# Patient Record
Sex: Female | Born: 1965 | Smoking: Light tobacco smoker
Health system: Southern US, Community
[De-identification: ages and names within clinical notes are randomized; demographics above are authoritative.]

## PROBLEM LIST (undated history)

## (undated) DIAGNOSIS — T4145XA Adverse effect of unspecified anesthetic, initial encounter: Secondary | ICD-10-CM

## (undated) DIAGNOSIS — T8859XA Other complications of anesthesia, initial encounter: Secondary | ICD-10-CM

## (undated) DIAGNOSIS — F329 Major depressive disorder, single episode, unspecified: Secondary | ICD-10-CM

## (undated) DIAGNOSIS — K219 Gastro-esophageal reflux disease without esophagitis: Secondary | ICD-10-CM

## (undated) DIAGNOSIS — F32A Depression, unspecified: Secondary | ICD-10-CM

## (undated) HISTORY — PX: CHOLECYSTECTOMY: SHX55

---
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
## 2017-08-23 DIAGNOSIS — G252 Other specified forms of tremor: Secondary | ICD-10-CM | POA: Diagnosis not present

## 2017-08-23 DIAGNOSIS — I10 Essential (primary) hypertension: Secondary | ICD-10-CM

## 2017-08-23 DIAGNOSIS — F17210 Nicotine dependence, cigarettes, uncomplicated: Secondary | ICD-10-CM | POA: Diagnosis not present

## 2017-08-23 DIAGNOSIS — F10230 Alcohol dependence with withdrawal, uncomplicated: Secondary | ICD-10-CM

## 2017-08-23 DIAGNOSIS — E872 Acidosis: Secondary | ICD-10-CM

## 2017-08-23 DIAGNOSIS — Y909 Presence of alcohol in blood, level not specified: Secondary | ICD-10-CM | POA: Diagnosis not present

## 2017-08-23 DIAGNOSIS — F419 Anxiety disorder, unspecified: Secondary | ICD-10-CM | POA: Diagnosis not present

## 2017-08-23 DIAGNOSIS — R000 Tachycardia, unspecified: Secondary | ICD-10-CM

## 2017-08-24 DIAGNOSIS — R000 Tachycardia, unspecified: Secondary | ICD-10-CM | POA: Diagnosis not present

## 2017-08-24 DIAGNOSIS — F10230 Alcohol dependence with withdrawal, uncomplicated: Secondary | ICD-10-CM | POA: Diagnosis not present

## 2017-08-24 DIAGNOSIS — I10 Essential (primary) hypertension: Secondary | ICD-10-CM | POA: Diagnosis not present

## 2017-08-24 DIAGNOSIS — E872 Acidosis: Secondary | ICD-10-CM | POA: Diagnosis not present

## 2017-08-28 ENCOUNTER — Encounter (HOSPITAL_COMMUNITY): Payer: Self-pay | Admitting: Emergency Medicine
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
## 2017-08-29 ENCOUNTER — Encounter (HOSPITAL_COMMUNITY): Payer: Self-pay

## 2017-08-29 ENCOUNTER — Inpatient Hospital Stay (HOSPITAL_COMMUNITY)
Admission: AD | Admit: 2017-08-29 | Discharge: 2017-08-31 | DRG: 885 | Disposition: A | Discharge status: Home / Self Care | Payer: BC Managed Care – PPO | Source: Intra-hospital | Attending: Psychiatry | Admitting: Psychiatry

## 2017-08-29 ENCOUNTER — Other Ambulatory Visit: Payer: Self-pay

## 2017-08-29 DIAGNOSIS — F332 Major depressive disorder, recurrent severe without psychotic features: Principal | ICD-10-CM | POA: Diagnosis present

## 2017-08-29 DIAGNOSIS — Z598 Other problems related to housing and economic circumstances: Secondary | ICD-10-CM

## 2017-08-29 DIAGNOSIS — R45851 Suicidal ideations: Secondary | ICD-10-CM | POA: Diagnosis present

## 2017-08-29 DIAGNOSIS — F419 Anxiety disorder, unspecified: Secondary | ICD-10-CM | POA: Diagnosis not present

## 2017-08-29 DIAGNOSIS — Z91410 Personal history of adult physical and sexual abuse: Secondary | ICD-10-CM

## 2017-08-29 DIAGNOSIS — F339 Major depressive disorder, recurrent, unspecified: Secondary | ICD-10-CM | POA: Diagnosis not present

## 2017-08-29 DIAGNOSIS — F329 Major depressive disorder, single episode, unspecified: Secondary | ICD-10-CM | POA: Diagnosis present

## 2017-08-29 DIAGNOSIS — F321 Major depressive disorder, single episode, moderate: Secondary | ICD-10-CM | POA: Diagnosis not present

## 2017-08-29 DIAGNOSIS — Z91411 Personal history of adult psychological abuse: Secondary | ICD-10-CM

## 2017-08-29 DIAGNOSIS — G4700 Insomnia, unspecified: Secondary | ICD-10-CM | POA: Diagnosis not present

## 2017-08-29 DIAGNOSIS — F1099 Alcohol use, unspecified with unspecified alcohol-induced disorder: Secondary | ICD-10-CM

## 2017-08-29 DIAGNOSIS — F17210 Nicotine dependence, cigarettes, uncomplicated: Secondary | ICD-10-CM | POA: Diagnosis present

## 2017-08-29 DIAGNOSIS — F4310 Post-traumatic stress disorder, unspecified: Secondary | ICD-10-CM

## 2017-08-29 MED ORDER — HYDROXYZINE HCL 10 MG PO TABS
10.0000 mg | ORAL_TABLET | ORAL | Status: DC | PRN
  Administered 2017-08-29 – 2017-08-30 (×3): 10 mg via ORAL
  Filled 2017-08-29 (×3): qty 1

## 2017-08-29 MED ORDER — CITALOPRAM HYDROBROMIDE 10 MG PO TABS
10.0000 mg | ORAL_TABLET | Freq: Every day | ORAL | Status: DC
  Administered 2017-08-29 – 2017-08-31 (×3): 10 mg via ORAL
  Filled 2017-08-29 (×6): qty 1

## 2017-08-29 MED ORDER — MAGNESIUM HYDROXIDE 400 MG/5ML PO SUSP: 30.0000 mL | Freq: Every day | ORAL | Status: DC | PRN

## 2017-08-29 MED ORDER — FOLIC ACID 1 MG PO TABS
1.0000 mg | ORAL_TABLET | Freq: Every day | ORAL | Status: DC
  Administered 2017-08-29 – 2017-08-31 (×3): 1 mg via ORAL
  Filled 2017-08-29 (×6): qty 1

## 2017-08-29 MED ORDER — LORAZEPAM 0.5 MG PO TABS: 0.5000 mg | ORAL_TABLET | Freq: Four times a day (QID) | ORAL | Status: DC | PRN

## 2017-08-29 MED ORDER — ALUM & MAG HYDROXIDE-SIMETH 200-200-20 MG/5ML PO SUSP: 30.0000 mL | ORAL | Status: DC | PRN

## 2017-08-29 MED ORDER — VITAMIN B-1 100 MG PO TABS
100.0000 mg | ORAL_TABLET | Freq: Every day | ORAL | Status: DC
  Administered 2017-08-29 – 2017-08-31 (×3): 100 mg via ORAL
  Filled 2017-08-29 (×8): qty 1

## 2017-08-29 MED ORDER — TRAZODONE HCL 50 MG PO TABS
50.0000 mg | ORAL_TABLET | Freq: Every evening | ORAL | Status: DC | PRN
  Administered 2017-08-29 – 2017-08-30 (×2): 50 mg via ORAL
  Filled 2017-08-29 (×2): qty 1

## 2017-08-29 MED ORDER — ACETAMINOPHEN 325 MG PO TABS: 650.0000 mg | ORAL_TABLET | Freq: Four times a day (QID) | ORAL | Status: DC | PRN
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
## 2017-08-29 NOTE — Tx Team (Signed)
Initial Treatment Plan 08/29/2017 1:03 PM [name] ZOX:[id number]    PATIENT STRESSORS: Medication change or noncompliance   PATIENT STRENGTHS: Ability for insight Communication skills Motivation for treatment/growth   PATIENT IDENTIFIED PROBLEMS: "I have become depressed been tearful and crying.  I need some help"                     DISCHARGE CRITERIA:  Motivation to continue treatment in a less acute level of care  PRELIMINARY DISCHARGE PLAN: Return to previous living arrangement  PATIENT/FAMILY INVOLVEMENT: This treatment plan has been presented to and reviewed with the patient, [name], and/or family member,   The patient and family have been given the opportunity to ask questions and make suggestions.  Jerrye Bushy, RN 08/29/2017, 1:03 PM

## 2017-08-29 NOTE — Progress Notes (Signed)
[facility] Group Notes:  (Nursing/MHT/Case Management/Adjunct)  Date:  08/29/2017  Time:  6:25 PM  Type of Therapy:  Psychoeducational Skills  Participation Level:  Active  Participation Quality:  Appropriate and Attentive  Affect:  Appropriate and Excited  Cognitive:  Alert  Insight:  Improving  Engagement in Group:  Engaged  Modes of Intervention:  Discussion and Socialization  Summary of Progress/Problems: Patient was active and engaged in group session on stress management skills. Discussed boundary setting, prioritization of goals for the day, time management, and healthy relationships.  Bria Portales C Trenda Corliss 08/29/2017, 6:25 PM 
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
## 2017-08-29 NOTE — [facility] Suicide Risk Assessment (Signed)
[facility] INPATIENT:  Family/Significant Other Suicide Prevention Education  Suicide Prevention Education:  Education Completed; Sharetha Newson (Son) [phone],  (name of family member/significant other) has been identified by the patient as the family member/significant other with whom the patient will be residing, and identified as the person(s) who will aid the patient in the event of a mental health crisis (suicidal ideations/suicide attempt).  With written consent from the patient, the family member/significant other has been provided the following suicide prevention education, prior to the and/or following the discharge of the patient.  The suicide prevention education provided includes the following:  Suicide risk factors  Suicide prevention and interventions  National Suicide Hotline telephone number  [facility] assessment telephone number  [facility] Emergency Assistance 911  [facility] and/or Residential Mobile Crisis Unit telephone number  Request made of family/significant other to:  Remove weapons (e.g., guns, rifles, knives), all items previously/currently identified as safety concern.    Remove drugs/medications (over-the-counter, prescriptions, illicit drugs), all items previously/currently identified as a safety concern.  The family member/significant other verbalizes understanding of the suicide prevention education information provided.  The family member/significant other agrees to remove the items of safety concern listed above. Son did not respond confirming or denying the suggestion to remove or limit access to medications. Son response was "thank you for the information" and the call disconnected.   Shellia Cleverly 08/29/2017, 4:16 PM

## 2017-08-30 DIAGNOSIS — G4700 Insomnia, unspecified: Secondary | ICD-10-CM

## 2017-08-30 DIAGNOSIS — F339 Major depressive disorder, recurrent, unspecified: Secondary | ICD-10-CM

## 2017-08-30 LAB — URINALYSIS, ROUTINE W REFLEX MICROSCOPIC
BILIRUBIN URINE: NEGATIVE
GLUCOSE, UA: NEGATIVE mg/dL
Hgb urine dipstick: NEGATIVE
KETONES UR: 5 mg/dL — AB
Leukocytes, UA: NEGATIVE
NITRITE: NEGATIVE
PH: 6 (ref 5.0–8.0)
Protein, ur: NEGATIVE mg/dL
Specific Gravity, Urine: 1.01 (ref 1.005–1.030)

## 2017-08-30 LAB — RAPID URINE DRUG SCREEN, HOSP PERFORMED
Amphetamines: NOT DETECTED
BARBITURATES: NOT DETECTED
Benzodiazepines: POSITIVE — AB
Cocaine: NOT DETECTED
Opiates: NOT DETECTED
Tetrahydrocannabinol: NOT DETECTED

## 2017-08-30 LAB — HEMOGLOBIN A1C
Hgb A1c MFr Bld: 5.7 % — ABNORMAL HIGH (ref 4.8–5.6)
MEAN PLASMA GLUCOSE: 116.89 mg/dL

## 2017-08-30 LAB — TSH: TSH: 1.519 u[IU]/mL (ref 0.350–4.500)

## 2017-08-30 NOTE — [facility] Group Notes (Signed)
[facility] LCSW Group Therapy Note  Date/Time: 08/30/17, 1315  Type of Therapy and Topic:  Group Therapy:  Overcoming Obstacles  Participation Level:  active  Description of Group:    In this group patients will be encouraged to explore what they see as obstacles to their own wellness and recovery. They will be guided to discuss their thoughts, feelings, and behaviors related to these obstacles. The group will process together ways to cope with barriers, with attention given to specific choices patients can make. Each patient will be challenged to identify changes they are motivated to make in order to overcome their obstacles. This group will be process-oriented, with patients participating in exploration of their own experiences as well as giving and receiving support and challenge from other group members.  Therapeutic Goals: 1. Patient will identify personal and current obstacles as they relate to admission. 2. Patient will identify barriers that currently interfere with their wellness or overcoming obstacles.  3. Patient will identify feelings, thought process and behaviors related to these barriers. 4. Patient will identify two changes they are willing to make to overcome these obstacles:    Summary of Patient Progress: Pt downplayed having obstacles in her life.  Pt stated she was at [facility] due to misunderstanding.  Pt was active in group discussion about finding ways to take steps to overcome obstacles and was a positive part of the group.        Therapeutic Modalities:   Cognitive Behavioral Therapy Solution Focused Therapy Motivational Interviewing Relapse Prevention Therapy  Daleen Squibb, LCSW

## 2017-08-30 NOTE — [facility] Group Notes (Signed)
[facility] Group Notes:  (Nursing/MHT/Case Management/Adjunct)  Date:  08/30/2017  Time:  4:15 pm  Type of Therapy:  Psychoeducational Skills  Participation Level:  Active  Participation Quality:  Appropriate  Affect:  Appropriate  Cognitive:  Appropriate  Insight:  Appropriate  Engagement in Group:  Engaged  Modes of Intervention:  Discussion  Summary of Progress/Problems: Patient alert and engaged appropriately in group.   Earline Mayotte 08/30/2017, 6:14 PM

## 2017-08-30 NOTE — Progress Notes (Signed)
D:  Patient's self inventory sheet, patient sleeps good, sleep medication helpful.  Good appetite, normal energy level, good concentration.  Rated depression, hopeless 1, anxiety 3.  Denied withdrawals.  Denied SI.  Denied physical problems.  Denied physical pain.  Goal is discharge.   A:  Medications administered per MD orders.  Emotional support and encouragement given patient. R:  Denied SI and HI, contracts for safety.  Denied A/V hallucinations.  Safety maintained with 15 minute checks.

## 2017-08-30 NOTE — Tx Team (Signed)
Interdisciplinary Treatment and Diagnostic Plan Update  08/30/2017 Time of Session: 10:00am [name] MRN: [id number]  Principal Diagnosis: <principal problem not specified>  Secondary Diagnoses: Active Problems:   Depression, major, single episode, moderate (HCC)   Major depression, recurrent, chronic (HCC)   Current Medications:  Current Facility-Administered Medications  Medication Dose Route Frequency Provider Last Rate Last Dose  . acetaminophen (TYLENOL) tablet 650 mg  650 mg Oral Q6H PRN Sharma Covert, MD      . alum & mag hydroxide-simeth (MAALOX/MYLANTA) 200-200-20 MG/5ML suspension 30 mL  30 mL Oral Q4H PRN Sharma Covert, MD      . citalopram (CELEXA) tablet 10 mg  10 mg Oral Daily Sharma Covert, MD   10 mg at 08/30/17 0809  . folic acid (FOLVITE) tablet 1 mg  1 mg Oral Daily Sharma Covert, MD   1 mg at 08/30/17 0810  . hydrOXYzine (ATARAX/VISTARIL) tablet 10 mg  10 mg Oral Q4H PRN Sharma Covert, MD   10 mg at 08/30/17 0809  . LORazepam (ATIVAN) tablet 0.5 mg  0.5 mg Oral Q6H PRN Sharma Covert, MD      . magnesium hydroxide (MILK OF MAGNESIA) suspension 30 mL  30 mL Oral Daily PRN Sharma Covert, MD      . thiamine (VITAMIN B-1) tablet 100 mg  100 mg Oral Daily Sharma Covert, MD   100 mg at 08/30/17 0810  . traZODone (DESYREL) tablet 50 mg  50 mg Oral QHS PRN Sharma Covert, MD   50 mg at 08/29/17 2101   PTA Medications: No medications prior to admission.    Patient Stressors: Medication change or noncompliance  Patient Strengths: Ability for insight Agricultural engineer for treatment/growth  Treatment Modalities: Medication Management, Group therapy, Case management,  1 to 1 session with clinician, Psychoeducation, Recreational therapy.   Physician Treatment Plan for Primary Diagnosis: <principal problem not specified> Long Term Goal(s): Improvement in symptoms so as ready for discharge Improvement in  symptoms so as ready for discharge   Short Term Goals: Ability to identify changes in lifestyle to reduce recurrence of condition will improve Ability to verbalize feelings will improve Ability to demonstrate self-control will improve Ability to identify and develop effective coping behaviors will improve Ability to maintain clinical measurements within normal limits will improve Compliance with prescribed medications will improve Ability to identify changes in lifestyle to reduce recurrence of condition will improve Ability to verbalize feelings will improve Ability to disclose and discuss suicidal ideas Ability to demonstrate self-control will improve Ability to identify and develop effective coping behaviors will improve Ability to maintain clinical measurements within normal limits will improve Compliance with prescribed medications will improve Ability to identify triggers associated with substance abuse/mental health issues will improve  Medication Management: Evaluate patient's response, side effects, and tolerance of medication regimen.  Therapeutic Interventions: 1 to 1 sessions, Unit Group sessions and Medication administration.  Evaluation of Outcomes: Not Met  Physician Treatment Plan for Secondary Diagnosis: Active Problems:   Depression, major, single episode, moderate ([name])   Major depression, recurrent, chronic (HCC)  Long Term Goal(s): Improvement in symptoms so as ready for discharge Improvement in symptoms so as ready for discharge   Short Term Goals: Ability to identify changes in lifestyle to reduce recurrence of condition will improve Ability to verbalize feelings will improve Ability to demonstrate self-control will improve Ability to identify and develop effective coping behaviors will improve Ability to maintain clinical measurements within  normal limits will improve Compliance with prescribed medications will improve Ability to identify changes in  lifestyle to reduce recurrence of condition will improve Ability to verbalize feelings will improve Ability to disclose and discuss suicidal ideas Ability to demonstrate self-control will improve Ability to identify and develop effective coping behaviors will improve Ability to maintain clinical measurements within normal limits will improve Compliance with prescribed medications will improve Ability to identify triggers associated with substance abuse/mental health issues will improve     Medication Management: Evaluate patient's response, side effects, and tolerance of medication regimen.  Therapeutic Interventions: 1 to 1 sessions, Unit Group sessions and Medication administration.  Evaluation of Outcomes: Not Met   RN Treatment Plan for Primary Diagnosis: <principal problem not specified> Long Term Goal(s): Knowledge of disease and therapeutic regimen to maintain health will improve  Short Term Goals: Ability to remain free from injury will improve, Ability to demonstrate self-control, Ability to verbalize feelings will improve, Ability to disclose and discuss suicidal ideas and Compliance with prescribed medications will improve  Medication Management: RN will administer medications as ordered by provider, will assess and evaluate patient's response and provide education to patient for prescribed medication. RN will report any adverse and/or side effects to prescribing provider.  Therapeutic Interventions: 1 on 1 counseling sessions, Psychoeducation, Medication administration, Evaluate responses to treatment, Monitor vital signs and CBGs as ordered, Perform/monitor CIWA, COWS, AIMS and Fall Risk screenings as ordered, Perform wound care treatments as ordered.  Evaluation of Outcomes: Not Met   LCSW Treatment Plan for Primary Diagnosis: <principal problem not specified> Long Term Goal(s): Safe transition to appropriate next level of care at discharge, Engage patient in therapeutic  group addressing interpersonal concerns.  Short Term Goals: Engage patient in aftercare planning with referrals and resources, Increase social support, Increase ability to appropriately verbalize feelings, Increase emotional regulation, Facilitate acceptance of mental health diagnosis and concerns, Facilitate patient progression through stages of change regarding substance use diagnoses and concerns, Identify triggers associated with mental health/substance abuse issues and Increase skills for wellness and recovery  Therapeutic Interventions: Assess for all discharge needs, 1 to 1 time with Social worker, Explore available resources and support systems, Assess for adequacy in community support network, Educate family and significant other(s) on suicide prevention, Complete Psychosocial Assessment, Interpersonal group therapy.  Evaluation of Outcomes: Not Met   Progress in Treatment: Attending groups: No. Participating in groups: No. Taking medication as prescribed: Yes. Toleration medication: Yes. Family/Significant other contact made: Yes, individual(s) contacted:  the patient's son Patient understands diagnosis: Yes. Discussing patient identified problems/goals with staff: Yes. Medical problems stabilized or resolved: Yes. Denies suicidal/homicidal ideation: Yes. Issues/concerns per patient self-inventory: No. Other:   New problem(s) identified: None   New Short Term/Long Term Goal(s):medication stabilization, elimination of SI thoughts, development of comprehensive mental wellness plan.   Patient Goals: "I dont have any goals, because I am not sick"  Discharge Plan or Barriers: CSW will continue to assess for an appropriate discharge plan   Reason for Continuation of Hospitalization: Medication stabilization  Estimated Length of Stay: Wednesday, 09/01/17   Attendees: Patient: [name]  08/30/2017 8:36 AM  Physician: Dr. Neita Garnet, MD 08/30/2017 8:36 AM  Nursing:  Rise Paganini.Raliegh Ip, RN 08/30/2017 8:36 AM  RN Care Manager: Rhunette Croft 08/30/2017 8:36 AM  Social Worker: Radonna Ricker, [place] 08/30/2017 8:36 AM  Recreational Therapist: X 08/30/2017 8:36 AM  Other: X 08/30/2017 8:36 AM  Other: X 08/30/2017 8:36 AM  Other:X 08/30/2017 8:36 AM    Scribe  for Treatment Team: Chevis Pretty 08/30/2017 8:36 AM

## 2017-08-30 NOTE — Progress Notes (Signed)
Patient ID: [name], female   DOB: Oct 20, 1965, 52 y.o.   MRN: [id number]  Pt currently presents with a masked affect and anxious behavior. Pt seen pacing in the hallways tonight. Pt reports to Clinical research associate that she feels as if she is doing well. Pt intrusive into other patients care, reports that she "would like to work in mental health." Unable to report any learned coping skills but reported benefiting from groups and discussions of self care. Pt reports not wanting to take sleep medication as it makes her feel "drowsy in the morning." Pt has concerns that this will keep her from discharge.  Pt provided with medications per providers orders. Pt's labs and vitals were monitored throughout the night. Pt given a 1:1 about emotional and mental status. Pt supported and encouraged to express concerns and questions. Pt educated on medications.  Pt's safety ensured with 15 minute and environmental checks. Pt currently denies SI/HI and A/V hallucinations. Pt verbally agrees to seek staff if SI/HI or A/VH occurs and to consult with staff before acting on any harmful thoughts. Will continue POC.

## 2017-08-30 NOTE — Progress Notes (Signed)
Patient ID: [name], female   DOB: [birth year], 52 y.o.   MRN: [id number] Pt continually minimizes situation-when asked about what brought her in, she state, "I was with my friend just suddenly I started cry. I don't think there was a reason for my crying; just started crying for no reason." Pt endorsed moderate anxiety; "I am only anxious because I'm here; I'm sure you would be anxious too if you were in my shoes." Pt denied SI/HI, AVH, pain or depression. Medications offered as prescribed. All patient's questions and concerns addressed. Support, encouragement, and safe environment provided. 15-minute safety checks continue. Pt was med compliant. Safety checks continue.

## 2017-08-30 NOTE — Progress Notes (Signed)
Pt attended group this evening. 

## 2017-08-30 NOTE — Progress Notes (Signed)
PATIENT SIGNED VOLUNTARY ADMISSION FORM ON 08/30/2017 AT 4:50 P.M.

## 2017-08-30 NOTE — Progress Notes (Signed)
[facility] MD Progress Note  08/30/2017 4:08 PM [name]  MRN:  [id number] Subjective:  Patient states she is feeling "OK". Today denies feeling depressed, denies SI. Currently denies suicidal ideations, and presents future oriented . Objective : I have discussed case with treatment team and have met with patient. 52 year old female, presented to [facility] ED reporting depression and binge drinking. Was discharged on Librium, became sedated, returned to ED, leading to concerns about suicidality, safety.  At this time patient denies feeling depressed, and presents with a full range of affect. Denies suicidal ideations and is future oriented, currently focused on being discharged soon. Denies medication side effects- currently on Celexa 10 mgrs QDAY. Does not present with symptoms of alcohol WDL - vitals stable. Describes episodic/infrequent drinking and denies pattern of regular consumption. Visible on unit, pleasant on approach.  Labs reviewed as below- TSH 1.51, HgbA1C slightly elevated at 5.7   Principal Problem: Consider Adjustment Disorder  Diagnosis:   Patient Active Problem List   Diagnosis Date Noted  . Major depression, recurrent, chronic ([place]) [F33.9] 08/29/2017  . Depression, major, single episode, moderate ([place]) [F32.1]    Total Time spent with patient: 20 minutes  Past Psychiatric History:  Past Medical History: History reviewed. No pertinent past medical history. History reviewed. No pertinent surgical history. Family History: History reviewed. No pertinent family history. Family Psychiatric  History:  Social History:  Social History   Substance and Sexual Activity  Alcohol Use Never  . Frequency: Never     Social History   Substance and Sexual Activity  Drug Use Never    Social History   Socioeconomic History  . Marital status: Unknown    Spouse name: Not on file  . Number of children: Not on file  . Years of education: Not on file  . Highest education  level: Not on file  Occupational History  . Not on file  Social Needs  . Financial resource strain: Not on file  . Food insecurity:    Worry: Not on file    Inability: Not on file  . Transportation needs:    Medical: Not on file    Non-medical: Not on file  Tobacco Use  . Smoking status: Light Tobacco Smoker    Packs/day: 0.25    Years: 10.00    Pack years: 2.50    Types: Cigarettes  . Smokeless tobacco: Never Used  Substance and Sexual Activity  . Alcohol use: Never    Frequency: Never  . Drug use: Never  . Sexual activity: Not on file  Lifestyle  . Physical activity:    Days per week: Not on file    Minutes per session: Not on file  . Stress: Not on file  Relationships  . Social connections:    Talks on phone: Not on file    Gets together: Not on file    Attends religious service: Not on file    Active member of club or organization: Not on file    Attends meetings of clubs or organizations: Not on file    Relationship status: Not on file  Other Topics Concern  . Not on file  Social History Narrative  . Not on file   Additional Social History:    Pain Medications: pt denies abuse - see pta meds list Prescriptions: pt denies abuse- see pta meds list Over the Counter: pt denies abuse - see pta meds list History of alcohol / drug use?: Yes Longest period of sobriety (when/how  long): n/a Name of Substance 1: etoh 1 - Age of First Use: unable to assess 1 - Amount (size/oz): unable to assess 1 - Last Use / Amount: unable to assess - BAL < .01  Sleep: Good  Appetite:  Good  Current Medications: Current Facility-Administered Medications  Medication Dose Route Frequency Provider Last Rate Last Dose  . acetaminophen (TYLENOL) tablet 650 mg  650 mg Oral Q6H PRN Sharma Covert, MD      . alum & mag hydroxide-simeth (MAALOX/MYLANTA) 200-200-20 MG/5ML suspension 30 mL  30 mL Oral Q4H PRN Sharma Covert, MD      . citalopram (CELEXA) tablet 10 mg  10 mg Oral  Daily Sharma Covert, MD   10 mg at 08/30/17 0809  . folic acid (FOLVITE) tablet 1 mg  1 mg Oral Daily Sharma Covert, MD   1 mg at 08/30/17 0810  . hydrOXYzine (ATARAX/VISTARIL) tablet 10 mg  10 mg Oral Q4H PRN Sharma Covert, MD   10 mg at 08/30/17 0809  . LORazepam (ATIVAN) tablet 0.5 mg  0.5 mg Oral Q6H PRN Sharma Covert, MD      . magnesium hydroxide (MILK OF MAGNESIA) suspension 30 mL  30 mL Oral Daily PRN Sharma Covert, MD      . thiamine (VITAMIN B-1) tablet 100 mg  100 mg Oral Daily Sharma Covert, MD   100 mg at 08/30/17 0810  . traZODone (DESYREL) tablet 50 mg  50 mg Oral QHS PRN Sharma Covert, MD   50 mg at 08/29/17 2101    Lab Results:  Results for orders placed or performed during the hospital encounter of 08/29/17 (from the past 48 hour(s))  TSH     Status: None   Collection Time: 08/30/17  6:29 AM  Result Value Ref Range   TSH 1.519 0.350 - 4.500 uIU/mL    Comment: Performed by a 3rd Generation assay with a functional sensitivity of <=0.01 uIU/mL. Performed at [facility], [place] [address] [place], [place] [address]   Hemoglobin A1c     Status: Abnormal   Collection Time: 08/30/17  6:29 AM  Result Value Ref Range   Hgb A1c MFr Bld 5.7 (H) 4.8 - 5.6 %    Comment: (NOTE) Pre diabetes:          5.7%-6.4% Diabetes:              >6.4% Glycemic control for   <7.0% adults with diabetes    Mean Plasma Glucose 116.89 mg/dL    Comment: Performed at [place] [address] [place], [place] [address]  Urinalysis, Routine w reflex microscopic     Status: Abnormal   Collection Time: 08/30/17  6:44 AM  Result Value Ref Range   Color, Urine YELLOW YELLOW   APPearance CLEAR CLEAR   Specific Gravity, Urine 1.010 1.005 - 1.030   pH 6.0 5.0 - 8.0   Glucose, UA NEGATIVE NEGATIVE mg/dL   Hgb urine dipstick NEGATIVE NEGATIVE   Bilirubin Urine NEGATIVE NEGATIVE   Ketones, ur 5 (A) NEGATIVE mg/dL   Protein, ur NEGATIVE  NEGATIVE mg/dL   Nitrite NEGATIVE NEGATIVE   Leukocytes, UA NEGATIVE NEGATIVE    Comment: Performed at [facility], [place] [address] [place],  [address]  Urine rapid drug screen (hosp performed)not at [facility]     Status: Abnormal   Collection Time: 08/30/17  6:44 AM  Result Value Ref Range  Opiates NONE DETECTED NONE DETECTED   Cocaine NONE DETECTED NONE DETECTED   Benzodiazepines POSITIVE (A) NONE DETECTED   Amphetamines NONE DETECTED NONE DETECTED   Tetrahydrocannabinol NONE DETECTED NONE DETECTED   Barbiturates NONE DETECTED NONE DETECTED    Comment: (NOTE) DRUG SCREEN FOR MEDICAL PURPOSES ONLY.  IF CONFIRMATION IS NEEDED FOR ANY PURPOSE, NOTIFY LAB WITHIN 5 DAYS. LOWEST DETECTABLE LIMITS FOR URINE DRUG SCREEN Drug Class                     Cutoff (ng/mL) Amphetamine and metabolites    1000 Barbiturate and metabolites    200 Benzodiazepine                 537 Tricyclics and metabolites     300 Opiates and metabolites        300 Cocaine and metabolites        300 THC                            50 Performed at [facility], [place] [address] [place], [place] [address]     Blood Alcohol level:  No results found for: [facility]  Metabolic Disorder Labs: Lab Results  Component Value Date   HGBA1C 5.7 (H) 08/30/2017   MPG 116.89 08/30/2017   No results found for: PROLACTIN No results found for: CHOL, TRIG, HDL, CHOLHDL, VLDL, LDLCALC  Physical Findings: AIMS: Facial and Oral Movements Muscles of Facial Expression: None, normal Lips and Perioral Area: None, normal Jaw: None, normal Tongue: None, normal,Extremity Movements Upper (arms, wrists, hands, fingers): None, normal Lower (legs, knees, ankles, toes): None, normal, Trunk Movements Neck, shoulders, hips: None, normal, Overall Severity Severity of abnormal movements (highest score from questions above): None, normal Incapacitation due to abnormal movements: None,  normal Patient's awareness of abnormal movements (rate only patient's report): No Awareness, Dental Status Current problems with teeth and/or dentures?: No Does patient usually wear dentures?: No  CIWA:  CIWA-Ar Total: 1 COWS:  COWS Total Score: 1  Musculoskeletal: Strength & Muscle Tone: within normal limits- no tremors, no diaphoresis, no restlessness or acute distress  Gait & Station: normal Patient leans: N/A  Psychiatric Specialty Exam: Physical Exam  ROS denies chest pain, no shortness of breath, no vomiting   Blood pressure 134/90, pulse 76, temperature 97.8 F (36.6 C), temperature source Oral, resp. rate 18, height 5' 4"  (1.626 m), weight 86.2 kg (190 lb), SpO2 98 %.Body mass index is 32.61 kg/m.  General Appearance: Well Groomed  Eye Contact:  Good  Speech:  Normal Rate  Volume:  Normal  Mood:  currently reports mood as normal , denies depression  Affect:  Appropriate and reactive, slightly anxious   Thought Process:  Linear and Descriptions of Associations: Intact  Orientation:  Full (Time, Place, and Person)  Thought Content:  no hallucinations, no delusions, not internally preoccupied   Suicidal Thoughts:  No currently denies suicidal or self injurious ideations, denies any homicidal or violent ideations, contracts for safety on unit   Homicidal Thoughts:  No  Memory:  recent and remote grossly intact   Judgement:  Fair- improving   Insight:  Fair  Psychomotor Activity:  Normal- no restlessness or agitation  Concentration:  Concentration: Good and Attention Span: Good  Recall:  Good  Fund of Knowledge:  Good  Language:  Good  Akathisia:  Negative  Handed:  Right  AIMS (if indicated):  Assets:  Communication Skills Desire for Improvement Resilience  ADL's:  Intact  Cognition:  WNL  Sleep:  Number of Hours: 5.75   Assessment -  52 year old female, presented to ED x 2 recently in the context of depression, episode of heavy drinking, and then sedation.  Currently reports she is doing well, and at this time denies depression and minimizes neuro-vegetative symptoms. Denies SI and her current focus is to discharge soon. No alcohol WDL symptoms at this time.  Treatment Plan Summary: Daily contact with patient to assess and evaluate symptoms and progress in treatment, Medication management, Plan inpatient treatment  and medications as below Encourage group and milieu participation to work on coping skills and symptom reduction Continue Celexa 10 mgrs QDAY for depression, anxiety Continue Trazodone 50 mgrs QHS PRN for insomnia Continue Vistaril 10 mgrs Q 6 hours PRN for anxiety as needed Continue Ativan 0.5 mgrs Q 6 hours PRN for anxiety if needed  Treatment team working on disposition planning options Jenne Campus, MD 08/30/2017, 4:08 PM

## 2017-08-30 NOTE — Progress Notes (Signed)
Adult Psychoeducational Group Note  Date:  08/30/2017 Time:  8:55 AM  Group Topic/Focus:  Orientation:   The focus of this group is to educate the patient on the purpose and policies of crisis stabilization and provide a format to answer questions about their admission.  The group details unit policies and expectations of patients while admitted.  Participation Level:  Active  Participation Quality:  Attentive  Affect:  Appropriate  Cognitive:  Appropriate  Insight: Appropriate  Engagement in Group:  Engaged  Modes of Intervention:  Discussion  Additional Comments:  Pt attended the morning group. She was excited about the wellness group coming up. I waned her to think about her definition of wellness to share in the next group after I gave her the book's.   Deforest Hoyles Vernia Teem 08/30/2017, 8:55 AM

## 2017-08-30 NOTE — Plan of Care (Signed)
Nurse discussed depression, anxiety, coping skills with patient.  

## 2017-08-31 DIAGNOSIS — F419 Anxiety disorder, unspecified: Secondary | ICD-10-CM

## 2017-08-31 DIAGNOSIS — F332 Major depressive disorder, recurrent severe without psychotic features: Principal | ICD-10-CM

## 2017-08-31 MED ORDER — HYDROXYZINE HCL 10 MG PO TABS: 10.0000 mg | ORAL_TABLET | ORAL | 0 refills | Status: DC | PRN

## 2017-08-31 MED ORDER — CITALOPRAM HYDROBROMIDE 20 MG PO TABS
20.0000 mg | ORAL_TABLET | Freq: Every day | ORAL | Status: DC
  Filled 2017-08-31 (×2): qty 1

## 2017-08-31 MED ORDER — TRAZODONE HCL 50 MG PO TABS: 50.0000 mg | ORAL_TABLET | Freq: Every evening | ORAL | 0 refills | Status: DC | PRN

## 2017-08-31 MED ORDER — CITALOPRAM HYDROBROMIDE 20 MG PO TABS: 20.0000 mg | ORAL_TABLET | Freq: Every day | ORAL | 0 refills | Status: DC

## 2017-08-31 NOTE — Progress Notes (Signed)
Discharge Note:  Patient discharged home with family member.  Patient denied SI and HI.  Denied A/V hallucinations.  Denied pain.  Suicide prevention information given and discussed with patient who stated she understood and had no questions.  Patient stated she appreciated all assistance received from [facility] staff.  Patient stated she received all her belongings, clothing, toiletries, misc items, prescriptions, etc.  All required discharge information given to patient at discharge.

## 2017-08-31 NOTE — BHH Suicide Risk Assessment (Signed)
[facility] Discharge Suicide Risk Assessment   Principal Problem: <principal problem not specified> Discharge Diagnoses:  Patient Active Problem List   Diagnosis Date Noted  . Major depression, recurrent, chronic ([facility]) [F33.9] 08/29/2017  . Depression, major, single episode, moderate ([facility]) [F32.1]     Total Time spent with patient: 30 minutes  Musculoskeletal: Strength & Muscle Tone: within normal limits Gait & Station: normal Patient leans: N/A  Psychiatric Specialty Exam: Review of Systems  All other systems reviewed and are negative.   Blood pressure 127/90, pulse 88, temperature 97.8 F (36.6 C), temperature source Oral, resp. rate 18, height  (1.626 m), weight 86.2 kg (190 lb), SpO2 98 %.Body mass index is 32.61 kg/m.  General Appearance: Casual  Eye Contact::  Good  Speech:  Normal Rate409  Volume:  Normal  Mood:  Euthymic  Affect:  Congruent  Thought Process:  Coherent  Orientation:  Full (Time, Place, and Person)  Thought Content:  Logical  Suicidal Thoughts:  No  Homicidal Thoughts:  No  Memory:  Immediate;   Fair  Judgement:  Intact  Insight:  Fair  Psychomotor Activity:  Increased  Concentration:  Good  Recall:  [employer] of Knowledge:Good  Language: Fair  Akathisia:  Negative  Handed:  Right  AIMS (if indicated):     Assets:  Communication Skills Desire for Improvement Housing Intimacy Resilience Social Support  Sleep:  Number of Hours: 6  Cognition: WNL  ADL's:  Intact   Mental Status Per Nursing Assessment::   On Admission:  NA  Demographic Factors:  Divorced or widowed and Low socioeconomic status  Loss Factors: NA  Historical Factors: Impulsivity  Risk Reduction Factors:   NA  Continued Clinical Symptoms:  Alcohol/Substance Abuse/Dependencies  Cognitive Features That Contribute To Risk:  None    Suicide Risk:  Minimal: No identifiable suicidal ideation.  Patients presenting with no risk factors but with morbid ruminations;  may be classified as minimal risk based on the severity of the depressive symptoms    Plan Of Care/Follow-up recommendations:  Activity:  ad lib  Diamond Pert, MD 08/31/2017, 9:15 AM
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
## 2017-08-31 NOTE — Progress Notes (Signed)
D:  Patient's self inventory sheet, patient sleeps good, sleep medication helpful.  Good appetite, normal energy level, good concentration.  Rated depression, hopeless and anxiety #1.  Denied withdrawals.  Denied SI.  Denied physial problems.  Denied physical pain.  Goal is discharge.  Plans to follow procedures for that purpose.  Thank you to staff.  They are great people with lots of compassion and understanding.  Always willing to hear and provide assistance. A:  Medications administered per MD orders.  Emotional support and encouragement given patient. R:  Denied SI and HI, contracts for safety.  Denied A/V hallucinations.  Safety maintained with 15 minute checks.

## 2017-08-31 NOTE — Progress Notes (Signed)
  [facility] Adult Case Management Discharge Plan :  Will you be returning to the same living situation after discharge:  Yes,  patient is returning home with her husband and children At discharge, do you have transportation home?: Yes,  patient reports a family member will pick her up Do you have the ability to pay for your medications: Yes,  [facility]  Release of information consent forms completed and in the chart;  Patient's signature needed at discharge.  Patient to Follow up at: Follow-up Information    [facility] [employer]. Go on 09/01/2017.   Why:  Appointment is Wednesday, 09/01/17 at 1:45pm. Please bring your Photo ID, social security card, proof of insurance or proof of income, and any discharge paperwork from your hospitalization.  Contact information: [address] [place] [place] [address] [phone]           Next level of care provider has access to [facility] Link:yes  Safety Planning and Suicide Prevention discussed: Yes,  with the patient's son  Have you used any form of tobacco in the last 30 days? (Cigarettes, Smokeless Tobacco, Cigars, and/or Pipes): Yes  Has patient been referred to the Quitline?: Patient refused referral  Patient has been referred for addiction treatment: N/A  Maeola Sarah, LCSWA 08/31/2017, 11:09 AM

## 2019-06-14 ENCOUNTER — Emergency Department (HOSPITAL_COMMUNITY): Payer: BC Managed Care – PPO

## 2019-06-14 ENCOUNTER — Other Ambulatory Visit: Payer: Self-pay

## 2019-06-14 ENCOUNTER — Inpatient Hospital Stay (HOSPITAL_COMMUNITY)
Admission: EM | Admit: 2019-06-14 | Discharge: 2019-06-16 | DRG: 897 | Disposition: A | Discharge status: Home / Self Care | Payer: BC Managed Care – PPO | Attending: Internal Medicine | Admitting: Internal Medicine

## 2019-06-14 ENCOUNTER — Encounter (HOSPITAL_COMMUNITY): Payer: Self-pay | Admitting: Emergency Medicine

## 2019-06-14 DIAGNOSIS — R000 Tachycardia, unspecified: Secondary | ICD-10-CM | POA: Diagnosis present

## 2019-06-14 DIAGNOSIS — F10929 Alcohol use, unspecified with intoxication, unspecified: Secondary | ICD-10-CM | POA: Diagnosis present

## 2019-06-14 DIAGNOSIS — F10939 Alcohol use, unspecified with withdrawal, unspecified: Secondary | ICD-10-CM | POA: Diagnosis present

## 2019-06-14 DIAGNOSIS — F10229 Alcohol dependence with intoxication, unspecified: Secondary | ICD-10-CM | POA: Diagnosis present

## 2019-06-14 DIAGNOSIS — R112 Nausea with vomiting, unspecified: Secondary | ICD-10-CM | POA: Diagnosis not present

## 2019-06-14 DIAGNOSIS — G312 Degeneration of nervous system due to alcohol: Secondary | ICD-10-CM | POA: Diagnosis present

## 2019-06-14 DIAGNOSIS — Z20822 Contact with and (suspected) exposure to covid-19: Secondary | ICD-10-CM | POA: Diagnosis present

## 2019-06-14 DIAGNOSIS — R251 Tremor, unspecified: Secondary | ICD-10-CM | POA: Diagnosis present

## 2019-06-14 DIAGNOSIS — F339 Major depressive disorder, recurrent, unspecified: Secondary | ICD-10-CM | POA: Diagnosis present

## 2019-06-14 DIAGNOSIS — E860 Dehydration: Secondary | ICD-10-CM | POA: Diagnosis present

## 2019-06-14 DIAGNOSIS — F10920 Alcohol use, unspecified with intoxication, uncomplicated: Secondary | ICD-10-CM

## 2019-06-14 DIAGNOSIS — F10930 Alcohol use, unspecified with withdrawal, uncomplicated: Secondary | ICD-10-CM

## 2019-06-14 DIAGNOSIS — E872 Acidosis, unspecified: Secondary | ICD-10-CM

## 2019-06-14 DIAGNOSIS — N179 Acute kidney failure, unspecified: Secondary | ICD-10-CM | POA: Diagnosis present

## 2019-06-14 DIAGNOSIS — F17210 Nicotine dependence, cigarettes, uncomplicated: Secondary | ICD-10-CM | POA: Diagnosis present

## 2019-06-14 DIAGNOSIS — G9340 Encephalopathy, unspecified: Secondary | ICD-10-CM

## 2019-06-14 DIAGNOSIS — F10239 Alcohol dependence with withdrawal, unspecified: Secondary | ICD-10-CM | POA: Diagnosis present

## 2019-06-14 DIAGNOSIS — Z811 Family history of alcohol abuse and dependence: Secondary | ICD-10-CM

## 2019-06-14 DIAGNOSIS — F10230 Alcohol dependence with withdrawal, uncomplicated: Secondary | ICD-10-CM

## 2019-06-14 DIAGNOSIS — E876 Hypokalemia: Secondary | ICD-10-CM | POA: Diagnosis present

## 2019-06-14 DIAGNOSIS — F419 Anxiety disorder, unspecified: Secondary | ICD-10-CM | POA: Diagnosis present

## 2019-06-14 HISTORY — DX: Gastro-esophageal reflux disease without esophagitis: K21.9

## 2019-06-14 HISTORY — DX: Other complications of anesthesia, initial encounter: T88.59XA

## 2019-06-14 HISTORY — DX: Depression, unspecified: F32.A

## 2019-06-14 LAB — ACETAMINOPHEN LEVEL: Acetaminophen (Tylenol), Serum: <10 ug/mL — ABNORMAL LOW (ref 10–30)

## 2019-06-14 LAB — COMPREHENSIVE METABOLIC PANEL
ALT: 64 U/L — ABNORMAL HIGH (ref 0–44)
AST: 83 U/L — ABNORMAL HIGH (ref 15–41)
Albumin: 3.8 g/dL (ref 3.5–5.0)
Alkaline Phosphatase: 59 U/L (ref 38–126)
Anion gap: 18 — ABNORMAL HIGH (ref 5–15)
BUN: 10 mg/dL (ref 6–20)
CO2: 21 mmol/L — ABNORMAL LOW (ref 22–32)
Calcium: 8.6 mg/dL — ABNORMAL LOW (ref 8.9–10.3)
Chloride: 100 mmol/L (ref 98–111)
Creatinine, Ser: 2.01 mg/dL — ABNORMAL HIGH (ref 0.44–1.00)
GFR calc Af Amer: 32 mL/min — ABNORMAL LOW (ref >60)
GFR calc non Af Amer: 28 mL/min — ABNORMAL LOW (ref >60)
Glucose, Bld: 149 mg/dL — ABNORMAL HIGH (ref 70–99)
Potassium: 4.2 mmol/L (ref 3.5–5.1)
Sodium: 139 mmol/L (ref 135–145)
Total Bilirubin: 1.4 mg/dL — ABNORMAL HIGH (ref 0.3–1.2)
Total Protein: 7 g/dL (ref 6.5–8.1)

## 2019-06-14 LAB — CBC
HCT: 40.2 % (ref 36.0–46.0)
Hemoglobin: 13.5 g/dL (ref 12.0–15.0)
MCH: 28.7 pg (ref 26.0–34.0)
MCHC: 33.6 g/dL (ref 30.0–36.0)
MCV: 85.5 fL (ref 80.0–100.0)
Platelets: 286 10*3/uL (ref 150–400)
RBC: 4.7 MIL/uL (ref 3.87–5.11)
RDW: 14.7 % (ref 11.5–15.5)
WBC: 8.8 10*3/uL (ref 4.0–10.5)
nRBC: 0 % (ref 0.0–0.2)

## 2019-06-14 LAB — SALICYLATE LEVEL: Salicylate Lvl: <7 mg/dL — ABNORMAL LOW (ref 7.0–30.0)

## 2019-06-14 LAB — LACTIC ACID, PLASMA
Lactic Acid, Venous: 3.6 mmol/L (ref 0.5–1.9)
Lactic Acid, Venous: 2.1 mmol/L (ref 0.5–1.9)

## 2019-06-14 LAB — ETHANOL: Alcohol, Ethyl (B): <10 mg/dL (ref <10)

## 2019-06-14 LAB — I-STAT BETA HCG BLOOD, ED (MC, WL, AP ONLY): I-stat hCG, quantitative: <5 m[IU]/mL (ref <5)

## 2019-06-14 LAB — LIPASE, BLOOD: Lipase: 37 U/L (ref 11–51)

## 2019-06-14 LAB — AMMONIA: Ammonia: 15 umol/L (ref 9–35)

## 2019-06-14 MED ORDER — LORAZEPAM 2 MG/ML IJ SOLN
1.0000 mg | Freq: Once | INTRAMUSCULAR | Status: AC
  Administered 2019-06-14: 19:00:00 1 mg via INTRAVENOUS
  Filled 2019-06-14: qty 1

## 2019-06-14 MED ORDER — LORAZEPAM 1 MG PO TABS: 0.0000 mg | ORAL_TABLET | Freq: Two times a day (BID) | ORAL | Status: DC

## 2019-06-14 MED ORDER — ONDANSETRON HCL 4 MG/2ML IJ SOLN
4.0000 mg | Freq: Once | INTRAMUSCULAR | Status: AC
  Administered 2019-06-14: 19:00:00 4 mg via INTRAVENOUS
  Filled 2019-06-14: qty 2

## 2019-06-14 MED ORDER — LACTATED RINGERS IV BOLUS
1000.0000 mL | Freq: Once | INTRAVENOUS | Status: AC
  Administered 2019-06-14: 1000 mL via INTRAVENOUS

## 2019-06-14 MED ORDER — LORAZEPAM 2 MG/ML IJ SOLN
0.0000 mg | Freq: Four times a day (QID) | INTRAMUSCULAR | Status: DC
  Administered 2019-06-15: 2 mg via INTRAVENOUS
  Filled 2019-06-14: qty 1

## 2019-06-14 MED ORDER — SODIUM CHLORIDE 0.9 % IV BOLUS
1000.0000 mL | Freq: Once | INTRAVENOUS | Status: AC
  Administered 2019-06-14: 1000 mL via INTRAVENOUS

## 2019-06-14 MED ORDER — THIAMINE HCL 100 MG PO TABS
100.0000 mg | ORAL_TABLET | Freq: Every day | ORAL | Status: DC
  Administered 2019-06-14 – 2019-06-16 (×3): 100 mg via ORAL
  Filled 2019-06-14 (×3): qty 1

## 2019-06-14 MED ORDER — LORAZEPAM 1 MG PO TABS
0.0000 mg | ORAL_TABLET | Freq: Four times a day (QID) | ORAL | Status: DC
  Filled 2019-06-14: qty 2

## 2019-06-14 MED ORDER — SODIUM CHLORIDE 0.9 % IV BOLUS
1000.0000 mL | Freq: Once | INTRAVENOUS | Status: AC
  Administered 2019-06-14: 19:00:00 1000 mL via INTRAVENOUS

## 2019-06-14 MED ORDER — LORAZEPAM 2 MG/ML IJ SOLN: 0.0000 mg | Freq: Two times a day (BID) | INTRAMUSCULAR | Status: DC

## 2019-06-14 MED ORDER — THIAMINE HCL 100 MG/ML IJ SOLN: 100.0000 mg | Freq: Every day | INTRAMUSCULAR | Status: DC

## 2019-06-14 MED ORDER — SODIUM CHLORIDE 0.9% FLUSH
3.0000 mL | Freq: Once | INTRAVENOUS | Status: AC
  Administered 2019-06-14: 3 mL via INTRAVENOUS

## 2019-06-14 NOTE — ED Notes (Signed)
Visually checked on Pt. Pt was sleeping but at 88 - 90 % with a good pleth. Repositioned pt into more of a upright position and she returned to 96%. Monitored for a while to make sure she remained WDl.

## 2019-06-14 NOTE — ED Triage Notes (Addendum)
Pt arrives to ED from home with complaints of vomiting and diarrhea x3 days. Patient seems to be too intoxicated and in pain to respond to triage questions so triage questions obtained from husband. Per husband patient took an unknown pill today and drank with it and since has had abdominal pain and vomiting. Pts husband states that they usually go to [place] but here today because she has cirrhosis.

## 2019-06-14 NOTE — ED Provider Notes (Signed)
[facility] EMERGENCY DEPARTMENT Provider Note   CSN: [id number] Arrival date & time: 06/14/19  1453     History Chief Complaint  Patient presents with  . Alcohol Intoxication  . Emesis    [name] is a 54 y.o. female.  Patient with history of depression, alcohol abuse --brought into the emergency department tonight with altered mental status, concern for alcohol intoxication.  Patient lives at home with her brother [name].  They have been intervening in the patient's drinking recently.  Per family, patient is suspected of sneaking out today and drinking.  Patient admits to drinking a bottle of wine earlier today.  She also reports several days of vomiting and agitation with decrease in drinking.  No seizures or hallucinations per family.  Level 5 caveat due to altered mental status.        History reviewed. No pertinent past medical history.  Patient Active Problem List   Diagnosis Date Noted  . Major depression, recurrent, chronic (HCC) 08/29/2017    History reviewed. No pertinent surgical history.   OB History    Gravida  1   Para      Term      Preterm      AB      Living        SAB      TAB      Ectopic      Multiple      Live Births              History reviewed. No pertinent family history.  Social History   Tobacco Use  . Smoking status: Light Tobacco Smoker    Packs/day: 0.25    Years: 10.00    Pack years: 2.50    Types: Cigarettes  . Smokeless tobacco: Never Used  Substance Use Topics  . Alcohol use: Never  . Drug use: Never    Home Medications Prior to Admission medications   Medication Sig Start Date End Date Taking? Authorizing Provider  citalopram (CELEXA) 20 MG tablet Take 1 tablet (20 mg total) by mouth daily. Patient not taking: Reported on 06/14/2019 09/01/17   Charm Rings, NP  hydrOXYzine (ATARAX/VISTARIL) 10 MG tablet Take 1 tablet (10 mg total) by mouth every 4 (four) hours as needed for  anxiety. Patient not taking: Reported on 06/14/2019 08/31/17   Charm Rings, NP  traZODone (DESYREL) 50 MG tablet Take 1 tablet (50 mg total) by mouth at bedtime as needed for sleep. Patient not taking: Reported on 06/14/2019 08/31/17   Charm Rings, NP    Allergies    Patient has no known allergies.  Review of Systems   Review of Systems  Unable to perform ROS: Mental status change    Physical Exam Updated Vital Signs BP 110/63   Pulse (!) 117   Temp 99 F (37.2 C) (Oral)   Resp (!) 26   Ht 5\' 4"  (1.626 m)   Wt 85.3 kg   SpO2 97%   BMI 32.27 kg/m   Physical Exam Vitals and nursing note reviewed.  Constitutional:      Appearance: She is well-developed.  HENT:     Head: Normocephalic and atraumatic.     Right Ear: Tympanic membrane, ear canal and external ear normal.     Left Ear: Tympanic membrane, ear canal and external ear normal.  Eyes:     General:        Right eye: No discharge.  Left eye: No discharge.     Conjunctiva/sclera: Conjunctivae normal.     Pupils: Pupils are equal, round, and reactive to light.  Cardiovascular:     Rate and Rhythm: Regular rhythm. Tachycardia present.     Heart sounds: Normal heart sounds.  Pulmonary:     Effort: Pulmonary effort is normal.     Breath sounds: Normal breath sounds.  Abdominal:     Palpations: Abdomen is soft.     Tenderness: There is no abdominal tenderness. There is no guarding or rebound.  Musculoskeletal:     Cervical back: Normal range of motion and neck supple.  Skin:    General: Skin is warm and dry.  Neurological:     General: No focal deficit present.     Mental Status: She is alert. She is disoriented.     Motor: No weakness.     Comments: Patient is mildly tremulous, she can move all extremities on command. Speech is slightly slurred.  No facial droop.     ED Results / Procedures / Treatments   Labs (all labs ordered are listed, but only abnormal results are displayed) Labs Reviewed   COMPREHENSIVE METABOLIC PANEL - Abnormal; Notable for the following components:      Result Value   CO2 21 (*)    Glucose, Bld 149 (*)    Creatinine, Ser 2.01 (*)    Calcium 8.6 (*)    AST 83 (*)    ALT 64 (*)    Total Bilirubin 1.4 (*)    GFR calc non Af Amer 28 (*)    GFR calc Af Amer 32 (*)    Anion gap 18 (*)    All other components within normal limits  ACETAMINOPHEN LEVEL - Abnormal; Notable for the following components:   Acetaminophen (Tylenol), Serum <10 (*)    All other components within normal limits  SALICYLATE LEVEL - Abnormal; Notable for the following components:   Salicylate Lvl <3.1 (*)    All other components within normal limits  LACTIC ACID, PLASMA - Abnormal; Notable for the following components:   Lactic Acid, Venous 3.6 (*)    All other components within normal limits  LACTIC ACID, PLASMA - Abnormal; Notable for the following components:   Lactic Acid, Venous 2.1 (*)    All other components within normal limits  URINE CULTURE  LIPASE, BLOOD  CBC  AMMONIA  ETHANOL  URINALYSIS, ROUTINE W REFLEX MICROSCOPIC  RAPID URINE DRUG SCREEN, HOSP PERFORMED  I-STAT BETA HCG BLOOD, ED ([facility], [facility], [facility] ONLY)    ED ECG REPORT   Date: 06/14/2019  Rate: 138  Rhythm: sinus tachycardia  QRS Axis: left  Intervals: normal  ST/T Wave abnormalities: normal  Conduction Disutrbances:none  Narrative Interpretation:   Old EKG Reviewed: none available  I have personally reviewed the EKG tracing and agree with the computerized printout as noted.   Radiology DG Chest Port 1 View  Result Date: 06/14/2019 CLINICAL DATA:  Altered level of consciousness, vomiting, and diarrhea for 3 days, light smoker EXAM: PORTABLE CHEST 1 VIEW COMPARISON:  Portable exam 5176 hours compared to 06/09/2019 FINDINGS: Normal heart size, mediastinal contours, and pulmonary vascularity. Lungs clear. No pulmonary infiltrate, pleural effusion, or pneumothorax. Osseous structures unremarkable. IMPRESSION:  No acute abnormalities. Electronically Signed   By: Lavonia Dana M.D.   On: 06/14/2019 19:04    Procedures Procedures (including critical care time)  Medications Ordered in ED Medications  LORazepam (ATIVAN) injection 0-4 mg (has no administration in  time range)    Or  LORazepam (ATIVAN) tablet 0-4 mg (has no administration in time range)  LORazepam (ATIVAN) injection 0-4 mg (has no administration in time range)    Or  LORazepam (ATIVAN) tablet 0-4 mg (has no administration in time range)  thiamine tablet 100 mg (has no administration in time range)    Or  thiamine (B-1) injection 100 mg (has no administration in time range)  sodium chloride flush (NS) 0.9 % injection 3 mL (3 mLs Intravenous Given 06/14/19 1856)  sodium chloride 0.9 % bolus 1,000 mL (0 mLs Intravenous Stopped 06/14/19 1853)  ondansetron (ZOFRAN) injection 4 mg (4 mg Intravenous Given 06/14/19 1854)  sodium chloride 0.9 % bolus 1,000 mL (1,000 mLs Intravenous New Bag/Given 06/14/19 1854)  LORazepam (ATIVAN) injection 1 mg (1 mg Intravenous Given 06/14/19 1856)    ED Course  I have reviewed the triage vital signs and the nursing notes.  Pertinent labs & imaging results that were available during my care of the patient were reviewed by me and considered in my medical decision making (see chart for details).  Patient seen and examined.  Patient is somewhat confused with slurred speech on arrival.  RN was able to speak with the patient's brother and obtain additional history as documented above.  Family member Spanish-speaking.  Confirmed no hallucinations.  Vital signs reviewed and are as follows: BP 110/63   Pulse (!) 117   Temp 99 F (37.2 C) (Oral)   Resp (!) 26   Ht 5\' 4"  (1.626 m)   Wt 85.3 kg   SpO2 97%   BMI 32.27 kg/m   Initial work-up with negative alcohol, normal ammonia.  On recheck, patient is more alert.  She gives additional history about drinking wine today.  Will ensure no abnormalities on head CT.   Patient will need admission for suspected alcohol withdrawal, CIWA protocol, AKI.   CIWA score was 11 per RN.    Lactate noted to be elevated at 3.6.  Will check chest x-ray and UA.  Patient without fevers.  At this point suspect that tachycardia and overall condition related to alcohol withdrawal.  I do not suspect primary infection at this point.  We will continue to hydrate and will recheck lactate.  8:20 PM Spoke with Dr. of [facility] who will see patient.      MDM Rules/Calculators/A&P                      Admit for suspected alcohol withdrawal, dehydration.   Final Clinical Impression(s) / ED Diagnoses Final diagnoses:  Alcohol withdrawal syndrome without complication (HCC)  Encephalopathy  Acute kidney injury [facility])    Rx / DC Orders ED Discharge Orders    None       [facility], PA-C 06/14/19 2022    2023, MD 06/14/19 2146

## 2019-06-14 NOTE — ED Notes (Signed)
Pt was sleeping. Woke Pt to check responsiveness. Pt answered questions and responded to commands.

## 2019-06-14 NOTE — H&P (Signed)
[facility] History and Physical  [name] NIO:[id number] DOB: 1966-01-31 DOA: 06/14/2019  Referring ED provider: Emmit Alexanders, PA PCP: Patient, No Pcp Per   Chief Complaint: Alcohol intoxication  HPI: [name] is a 54 y.o. female with PMH of MDD and alcohol abuse was brought to ER by family due to concern for alcohol intoxication. Found to have elevated lactate and AKI. Patient unable to provide almost any history. Reports feeling nauseous and that she had abdominal pain earlier but not currently. Unable to assess further. Per ED who spoke with patient's brother: "They have been intervening in the patient's drinking recently.  Per family, patient is suspected of sneaking out today and drinking.  Patient admits to drinking a bottle of wine earlier today.  She also reports several days of vomiting and agitation with decrease in drinking.  No seizures or hallucinations per family." Per son [name], he reports that she has been an alcoholic for as long as he can remember. Denies hx of withdrawal seizures, DT's, hallucinations, SI/HI. She will drink anything but prefers wine.   In the ED: Tachycardic with normal BP's and afebrile. Maintaining sats. CMP with Cr 2.01, glucose 149, AST 83, ALT 64. Lactate 3.6.   Review of Systems:  Unable to perform full ROS as patient with altered mental status. Did ask the following and patient denies but unreliable. Denies fever, chills, headache, dizziness, cough, SOB, chest pain,  diarrhea, constipation, dysuria, hematuria, hematochezia, melena, difficulty moving arms/legs, confusion or any other complaints. Alcohol level < 10. CBC, Tylenol, Salicylate, Ammonia level WNL. UA and UDS pending to be collected. Patient was given Ativan, thiamine and 2L IVF.  CT Head: Negative non contrasted CT appearance of the brain. CXR: No acute cardiopulmonary abnormality.  Called for admission due to suspected alcohol withdrawal, CIWA protocol, AKI.   History  reviewed. No pertinent past medical history. History reviewed. No pertinent surgical history. Social History:  reports that she has been smoking cigarettes. She has a 2.50 pack-year smoking history. She has never used smokeless tobacco. She reports that she does not drink alcohol or use drugs.  No Known Allergies  History reviewed.  Per son, [name]: No family hx of DM, HTN, HLD. Patient's father and grandfather with history of alcoholism.   Prior to Admission medications   Medication Sig Start Date End Date Taking? Authorizing Provider  citalopram (CELEXA) 20 MG tablet Take 1 tablet (20 mg total) by mouth daily. Patient not taking: Reported on 06/14/2019 09/01/17 06/14/19  Charm Rings, NP  traZODone (DESYREL) 50 MG tablet Take 1 tablet (50 mg total) by mouth at bedtime as needed for sleep. Patient not taking: Reported on 06/14/2019 08/31/17 06/14/19  Charm Rings, NP   Physical Exam: Vitals:   06/14/19 1813 06/14/19 1900 06/14/19 2019 06/14/19 2051  BP:  103/68 112/67 (!) 112/99  Pulse:  (!) 112 (!) 112 (!) 118  Resp:  (!) 21  16  Temp:      TempSrc:      SpO2:  97%  100%  Weight: 85.3 kg     Height: 5\' 4"  (1.626 m)       Wt Readings from Last 3 Encounters:  06/14/19 85.3 kg    General:  Resting in bed. Will barely open eyes. Mumbles responses. Oriented to person and place; unable to assess further.  Eyes: Barely opens eyes to commands but appears to have EOMI. ENT: grossly normal hearing, lips & tongue Neck: normal ROM Cardiovascular: Tachycardic with regular rhythm, no  m/r/g. No LE edema. Respiratory: CTA bilaterally, no w/r/r. Normal respiratory effort. Abdomen: soft, ntnd Skin: no rash or induration seen on limited exam Musculoskeletal: grossly normal tone BUE/BLE Psychiatric: grossly normal; slurred speech  Neurologic: grossly non-focal. Able to move all extremities. No facial droop.          Labs on Admission:  Basic Metabolic Panel: Recent Labs  Lab 06/14/19 1512   NA 139  K 4.2  CL 100  CO2 21*  GLUCOSE 149*  BUN 10  CREATININE 2.01*  CALCIUM 8.6*   Liver Function Tests: Recent Labs  Lab 06/14/19 1512  AST 83*  ALT 64*  ALKPHOS 59  BILITOT 1.4*  PROT 7.0  ALBUMIN 3.8   Recent Labs  Lab 06/14/19 1512  LIPASE 37   Recent Labs  Lab 06/14/19 1655  AMMONIA 15   CBC: Recent Labs  Lab 06/14/19 1512  WBC 8.8  HGB 13.5  HCT 40.2  MCV 85.5  PLT 286   Cardiac Enzymes: No results for input(s): CKTOTAL, CKMB, CKMBINDEX, TROPONINI in the last 168 hours.  BNP (last 3 results) No results for input(s): BNP in the last 8760 hours.  ProBNP (last 3 results) No results for input(s): PROBNP in the last 8760 hours.  CBG: No results for input(s): GLUCAP in the last 168 hours.  Radiological Exams on Admission: CT Head Wo Contrast  Result Date: 06/14/2019 CLINICAL DATA:  Altered mental status EXAM: CT HEAD WITHOUT CONTRAST TECHNIQUE: Contiguous axial images were obtained from the base of the skull through the vertex without intravenous contrast. COMPARISON:  CT brain 08/27/2017 FINDINGS: Brain: No acute territorial infarction, hemorrhage or intracranial mass. The ventricles are nonenlarged. Vascular: No hyperdense vessels.  No unexpected calcification Skull: Normal. Negative for fracture or focal lesion. Sinuses/Orbits: No acute finding. Other: None IMPRESSION: Negative non contrasted CT appearance of the brain. Electronically Signed   By: Donavan Foil M.D.   On: 06/14/2019 20:01   DG Chest Port 1 View  Result Date: 06/14/2019 CLINICAL DATA:  Altered level of consciousness, vomiting, and diarrhea for 3 days, light smoker EXAM: PORTABLE CHEST 1 VIEW COMPARISON:  Portable exam 1834 hours compared to 06/09/2019 FINDINGS: Normal heart size, mediastinal contours, and pulmonary vascularity. Lungs clear. No pulmonary infiltrate, pleural effusion, or pneumothorax. Osseous structures unremarkable. IMPRESSION: No acute abnormalities. Electronically  Signed   By: Lavonia Dana M.D.   On: 06/14/2019 19:04    EKG: Independently reviewed. HR 140. Sinus tachycardia. QTc 454. No STEMI.  Assessment/Plan Principal Problem:   Alcohol intoxication ([place]) Active Problems:   Major depression, recurrent, chronic (HCC)   Lactic acidosis   AKI (acute kidney injury) ([place])  54 y.o. female with PMH of MDD and alcohol abuse was brought to ER by family due to concern for alcohol intoxication. Found to have elevated lactate and AKI.  1. Alcohol Intoxication - last drink earlier today at unknown time; drank a bottle of wine - presents with tachycardia , slurred speech and history of nausea/vomiting with poor PO intake over last several days - CIWA protocol initiated and patient did receive Ativan although appears intoxicated on my exam and may not be withdrawing yet - Per family, no hx of withdrawal seizures, SI/HI, hallucinations - Patient will go several days without drinking and then binge per son - Cont CIWA and Ativan - Cont vitamins - F/u UDS - SW consulted for resources although patient unlikely desires to change habits - If AKI, lactate and AMS resolve by 3/4, patient can  likely discharge home  2. Elevated Lactate - Low suspicion for infection, likely due to dehydration and intoxication - Cont to follow lactate until normal; LR bolus ordered on admission - Afebrile, WBC WNL - CXR without acute findings - F/u UA  3. AKI - likely prerenal due to dehydration and hx of vomiting - Fluid rehydration and reassess in AM; consider urine lytes if not resolved - Cr 2.01 on admission; no prior for baseline but family denies CKD history   4. Depression - Hold Celexa due to acute encephalopathy   Code Status: Full - per family, patient unable to respond DVT Prophylaxis: Lovenox Family Communication: ED provider spoke with brother [name] and I spoke with son [name] by phone on admission. Disposition Plan: Admit to observation with Telemetry. Patient  with alcohol intoxication with elevated lactate and AKI. Assuming these resolve by tomorrow and mental status improves, likely discharge home 3/4.  Time spent: 45 minutes  Joselyn Arrow [facility] Pager [phone]

## 2019-06-15 ENCOUNTER — Encounter (HOSPITAL_COMMUNITY): Payer: Self-pay | Admitting: Family Medicine

## 2019-06-15 DIAGNOSIS — E860 Dehydration: Secondary | ICD-10-CM | POA: Diagnosis present

## 2019-06-15 DIAGNOSIS — F10230 Alcohol dependence with withdrawal, uncomplicated: Secondary | ICD-10-CM | POA: Diagnosis present

## 2019-06-15 DIAGNOSIS — F10229 Alcohol dependence with intoxication, unspecified: Secondary | ICD-10-CM | POA: Diagnosis present

## 2019-06-15 DIAGNOSIS — F10920 Alcohol use, unspecified with intoxication, uncomplicated: Secondary | ICD-10-CM | POA: Diagnosis not present

## 2019-06-15 DIAGNOSIS — R112 Nausea with vomiting, unspecified: Secondary | ICD-10-CM | POA: Diagnosis present

## 2019-06-15 DIAGNOSIS — G312 Degeneration of nervous system due to alcohol: Secondary | ICD-10-CM | POA: Diagnosis present

## 2019-06-15 DIAGNOSIS — E876 Hypokalemia: Secondary | ICD-10-CM | POA: Diagnosis present

## 2019-06-15 DIAGNOSIS — F10939 Alcohol use, unspecified with withdrawal, unspecified: Secondary | ICD-10-CM | POA: Diagnosis present

## 2019-06-15 DIAGNOSIS — R251 Tremor, unspecified: Secondary | ICD-10-CM | POA: Diagnosis present

## 2019-06-15 DIAGNOSIS — Z811 Family history of alcohol abuse and dependence: Secondary | ICD-10-CM | POA: Diagnosis not present

## 2019-06-15 DIAGNOSIS — F10239 Alcohol dependence with withdrawal, unspecified: Secondary | ICD-10-CM | POA: Diagnosis present

## 2019-06-15 DIAGNOSIS — F17210 Nicotine dependence, cigarettes, uncomplicated: Secondary | ICD-10-CM | POA: Diagnosis present

## 2019-06-15 DIAGNOSIS — N179 Acute kidney failure, unspecified: Secondary | ICD-10-CM | POA: Diagnosis present

## 2019-06-15 DIAGNOSIS — F339 Major depressive disorder, recurrent, unspecified: Secondary | ICD-10-CM | POA: Diagnosis present

## 2019-06-15 DIAGNOSIS — Z20822 Contact with and (suspected) exposure to covid-19: Secondary | ICD-10-CM | POA: Diagnosis present

## 2019-06-15 DIAGNOSIS — E872 Acidosis: Secondary | ICD-10-CM | POA: Diagnosis present

## 2019-06-15 DIAGNOSIS — R000 Tachycardia, unspecified: Secondary | ICD-10-CM | POA: Diagnosis present

## 2019-06-15 DIAGNOSIS — F419 Anxiety disorder, unspecified: Secondary | ICD-10-CM | POA: Diagnosis present

## 2019-06-15 LAB — COMPREHENSIVE METABOLIC PANEL
ALT: 49 U/L — ABNORMAL HIGH (ref 0–44)
AST: 46 U/L — ABNORMAL HIGH (ref 15–41)
Albumin: 3.1 g/dL — ABNORMAL LOW (ref 3.5–5.0)
Alkaline Phosphatase: 48 U/L (ref 38–126)
Anion gap: 10 (ref 5–15)
BUN: 6 mg/dL (ref 6–20)
CO2: 24 mmol/L (ref 22–32)
Calcium: 7.8 mg/dL — ABNORMAL LOW (ref 8.9–10.3)
Chloride: 106 mmol/L (ref 98–111)
Creatinine, Ser: 1.5 mg/dL — ABNORMAL HIGH (ref 0.44–1.00)
GFR calc Af Amer: 46 mL/min — ABNORMAL LOW (ref >60)
GFR calc non Af Amer: 40 mL/min — ABNORMAL LOW (ref >60)
Glucose, Bld: 130 mg/dL — ABNORMAL HIGH (ref 70–99)
Potassium: 3.2 mmol/L — ABNORMAL LOW (ref 3.5–5.1)
Sodium: 140 mmol/L (ref 135–145)
Total Bilirubin: 0.7 mg/dL (ref 0.3–1.2)
Total Protein: 5.7 g/dL — ABNORMAL LOW (ref 6.5–8.1)

## 2019-06-15 LAB — URINALYSIS, ROUTINE W REFLEX MICROSCOPIC
Bilirubin Urine: NEGATIVE
Glucose, UA: NEGATIVE mg/dL
Ketones, ur: 20 mg/dL — AB
Leukocytes,Ua: NEGATIVE
Nitrite: NEGATIVE
Protein, ur: NEGATIVE mg/dL
Specific Gravity, Urine: 1.014 (ref 1.005–1.030)
pH: 7 (ref 5.0–8.0)

## 2019-06-15 LAB — CBC
HCT: 33.3 % — ABNORMAL LOW (ref 36.0–46.0)
Hemoglobin: 10.9 g/dL — ABNORMAL LOW (ref 12.0–15.0)
MCH: 29.1 pg (ref 26.0–34.0)
MCHC: 32.7 g/dL (ref 30.0–36.0)
MCV: 88.8 fL (ref 80.0–100.0)
Platelets: 207 10*3/uL (ref 150–400)
RBC: 3.75 MIL/uL — ABNORMAL LOW (ref 3.87–5.11)
RDW: 15.1 % (ref 11.5–15.5)
WBC: 7.9 10*3/uL (ref 4.0–10.5)
nRBC: 0 % (ref 0.0–0.2)

## 2019-06-15 LAB — RAPID URINE DRUG SCREEN, HOSP PERFORMED
Amphetamines: NOT DETECTED
Barbiturates: NOT DETECTED
Benzodiazepines: POSITIVE — AB
Cocaine: NOT DETECTED
Opiates: NOT DETECTED
Tetrahydrocannabinol: NOT DETECTED

## 2019-06-15 LAB — SARS CORONAVIRUS 2 (TAT 6-24 HRS): SARS Coronavirus 2: NEGATIVE

## 2019-06-15 LAB — MAGNESIUM: Magnesium: 2 mg/dL (ref 1.7–2.4)

## 2019-06-15 LAB — PHOSPHORUS: Phosphorus: 2.6 mg/dL (ref 2.5–4.6)

## 2019-06-15 LAB — HIV ANTIBODY (ROUTINE TESTING W REFLEX): HIV Screen 4th Generation wRfx: NONREACTIVE

## 2019-06-15 LAB — LACTIC ACID, PLASMA: Lactic Acid, Venous: 1.3 mmol/L (ref 0.5–1.9)

## 2019-06-15 MED ORDER — LORAZEPAM 2 MG/ML IJ SOLN
1.0000 mg | INTRAMUSCULAR | Status: DC | PRN
  Administered 2019-06-15: 1 mg via INTRAVENOUS
  Administered 2019-06-16: 2 mg via INTRAVENOUS
  Filled 2019-06-15 (×2): qty 1

## 2019-06-15 MED ORDER — ONDANSETRON HCL 4 MG/2ML IJ SOLN
4.0000 mg | Freq: Four times a day (QID) | INTRAMUSCULAR | Status: DC | PRN
  Administered 2019-06-15: 4 mg via INTRAVENOUS
  Filled 2019-06-15: qty 2

## 2019-06-15 MED ORDER — LORAZEPAM 1 MG PO TABS
1.0000 mg | ORAL_TABLET | ORAL | Status: DC | PRN
  Administered 2019-06-15: 2 mg via ORAL
  Administered 2019-06-15: 1 mg via ORAL
  Administered 2019-06-15: 2 mg via ORAL
  Filled 2019-06-15 (×3): qty 2

## 2019-06-15 MED ORDER — POTASSIUM CHLORIDE CRYS ER 20 MEQ PO TBCR
40.0000 meq | EXTENDED_RELEASE_TABLET | ORAL | Status: AC
  Administered 2019-06-15 (×2): 40 meq via ORAL
  Filled 2019-06-15 (×2): qty 2

## 2019-06-15 MED ORDER — ENOXAPARIN SODIUM 40 MG/0.4ML ~~LOC~~ SOLN
40.0000 mg | SUBCUTANEOUS | Status: DC
  Administered 2019-06-16: 40 mg via SUBCUTANEOUS
  Filled 2019-06-15: qty 0.4

## 2019-06-15 MED ORDER — ENOXAPARIN SODIUM 40 MG/0.4ML ~~LOC~~ SOLN
40.0000 mg | Freq: Every day | SUBCUTANEOUS | Status: DC
  Administered 2019-06-15: 40 mg via SUBCUTANEOUS
  Filled 2019-06-15: qty 0.4

## 2019-06-15 MED ORDER — ADULT MULTIVITAMIN W/MINERALS CH
1.0000 | ORAL_TABLET | Freq: Every day | ORAL | Status: DC
  Administered 2019-06-15 – 2019-06-16 (×2): 1 via ORAL
  Filled 2019-06-15 (×2): qty 1

## 2019-06-15 MED ORDER — SODIUM CHLORIDE 0.9 % IV SOLN
INTRAVENOUS | Status: DC
  Administered 2019-06-15: 1000 mL via INTRAVENOUS

## 2019-06-15 MED ORDER — FOLIC ACID 1 MG PO TABS
1.0000 mg | ORAL_TABLET | Freq: Every day | ORAL | Status: DC
  Administered 2019-06-15 – 2019-06-16 (×2): 1 mg via ORAL
  Filled 2019-06-15 (×2): qty 1

## 2019-06-15 NOTE — ED Notes (Signed)
Visually checked on Pt. She was sleeping well, vitals WDL

## 2019-06-15 NOTE — Social Work (Signed)
CSW acknowledging consult for substance use. Will evaluate pt needs and interest in resources.   Jerrine Urschel, MSW, LCSW [facility]   

## 2019-06-15 NOTE — ED Notes (Signed)
Pt ambulatory to the restroom with a steady gait.

## 2019-06-15 NOTE — ED Notes (Signed)
Hooked patient back up to the monitor after the bathroom 

## 2019-06-15 NOTE — Progress Notes (Signed)
PROGRESS NOTE    [name]  ZOX:[id number] DOB: 16-Apr-1965 DOA: 06/14/2019 PCP: Patient, No Pcp Per   Brief Narrative:  HPI: [name] is a 54 y.o. female with PMH of MDD and alcohol abuse was brought to ER by family due to concern for alcohol intoxication. Found to have elevated lactate and AKI. Patient unable to provide almost any history. Reports feeling nauseous and that she had abdominal pain earlier but not currently. Unable to assess further. Per ED who spoke with patient's brother: "They have been intervening in the patient's drinking recently. Per family, patient is suspected of sneaking out today and drinking. Patient admits to drinking a bottle of wine earlier today. She also reports several days of vomiting and agitation with decrease in drinking. No seizures or hallucinations per family." Per son [name], he reports that she has been an alcoholic for as long as he can remember. Denies hx of withdrawal seizures, DT's, hallucinations, SI/HI. She will drink anything but prefers wine.   In the ED: Tachycardic with normal BP's and afebrile. Maintaining sats. CMP with Cr 2.01, glucose 149, AST 83, ALT 64. Lactate 3.6.  Assessment & Plan:   Principal Problem:   Alcohol intoxication (HCC) Active Problems:   Major depression, recurrent, chronic (HCC)   Lactic acidosis   AKI (acute kidney injury) (HCC)   Alcohol intoxication/alcohol withdrawal: Last drink yesterday morning on 06/14/2019.  Has some withdrawal currently.  Slightly elevated CIWA score.  Received only few doses of Ativan in last 24 hours.  Agreeable to stay overnight to seek help to go through the system.  Continue CIWA protocol, multivitamin.  Lactic acidosis: Resolved.  AKI/dehydration: Creatinine improving.  Will start on IV fluids.  Depression, chronic: Holding Celexa and trazodone for now.  Hypokalemia: 3.2.  Replace.  Recheck in the morning.  DVT prophylaxis: Lovenox   Code Status: Full Code    Family Communication:  None present at bedside.  Plan of care discussed with patient in length and he verbalized understanding and agreed with it. Patient is from: Home Disposition Plan: Home Barriers to discharge: Active alcohol withdrawal   Estimated body mass index is 32.27 kg/m as calculated from the following:   Height as of this encounter: 5\' 4"  (1.626 m).   Weight as of this encounter: 85.3 kg.      Nutritional status:               Consultants:   None  Procedures:   None  Antimicrobials:   None   Subjective: Seen and examined.  Complains of headache and some anxiety.  Has some tremors in upper extremities.  Agreeable to stay in the hospital for treatment of alcohol withdrawal.  Objective: Vitals:   06/15/19 1145 06/15/19 1209 06/15/19 1225 06/15/19 1312  BP:  133/80 133/80   Pulse: 98 (!) 101 99 86  Resp: 19  20   Temp:      TempSrc:      SpO2: 98%  98%   Weight:      Height:        Intake/Output Summary (Last 24 hours) at 06/15/2019 1353 Last data filed at 06/14/2019 1853 Gross per 24 hour  Intake 1000 ml  Output 300 ml  Net 700 ml   Filed Weights   06/14/19 1812 06/14/19 1813  Weight: 85.3 kg 85.3 kg    Examination:  General exam: Appears calm and comfortable  Respiratory system: Clear to auscultation. Respiratory effort normal. Cardiovascular system: S1 & S2 heard, RRR.  No JVD, murmurs, rubs, gallops or clicks. No pedal edema. Gastrointestinal system: Abdomen is nondistended, soft and nontender. No organomegaly or masses felt. Normal bowel sounds heard. Central nervous system: Alert and oriented. No focal neurological deficits. Extremities: Symmetric 5 x 5 power.  Fine tremors upper extremities bilaterally Skin: No rashes, lesions or ulcers Psychiatry: Judgement and insight appear normal. Mood & affect depressed.   Data Reviewed: I have personally reviewed following labs and imaging studies  CBC: Recent Labs  Lab  06/14/19 1512 06/15/19 0145  WBC 8.8 7.9  HGB 13.5 10.9*  HCT 40.2 33.3*  MCV 85.5 88.8  PLT 286 207   Basic Metabolic Panel: Recent Labs  Lab 06/14/19 1512 06/15/19 0115 06/15/19 0145  NA 139  --  140  K 4.2  --  3.2*  CL 100  --  106  CO2 21*  --  24  GLUCOSE 149*  --  130*  BUN 10  --  6  CREATININE 2.01*  --  1.50*  CALCIUM 8.6*  --  7.8*  MG  --  2.0  --   PHOS  --  2.6  --    GFR: Estimated Creatinine Clearance: 46.9 mL/min (A) (by C-G formula based on SCr of 1.5 mg/dL (H)). Liver Function Tests: Recent Labs  Lab 06/14/19 1512 06/15/19 0145  AST 83* 46*  ALT 64* 49*  ALKPHOS 59 48  BILITOT 1.4* 0.7  PROT 7.0 5.7*  ALBUMIN 3.8 3.1*   Recent Labs  Lab 06/14/19 1512  LIPASE 37   Recent Labs  Lab 06/14/19 1655  AMMONIA 15   Coagulation Profile: No results for input(s): INR, PROTIME in the last 168 hours. Cardiac Enzymes: No results for input(s): CKTOTAL, CKMB, CKMBINDEX, TROPONINI in the last 168 hours. BNP (last 3 results) No results for input(s): PROBNP in the last 8760 hours. HbA1C: No results for input(s): HGBA1C in the last 72 hours. CBG: No results for input(s): GLUCAP in the last 168 hours. Lipid Profile: No results for input(s): CHOL, HDL, LDLCALC, TRIG, CHOLHDL, LDLDIRECT in the last 72 hours. Thyroid Function Tests: No results for input(s): TSH, T4TOTAL, FREET4, T3FREE, THYROIDAB in the last 72 hours. Anemia Panel: No results for input(s): VITAMINB12, FOLATE, FERRITIN, TIBC, IRON, RETICCTPCT in the last 72 hours. Sepsis Labs: Recent Labs  Lab 06/14/19 1655 06/14/19 1943 06/15/19 0145  LATICACIDVEN 3.6* 2.1* 1.3    Recent Results (from the past 240 hour(s))  SARS CORONAVIRUS 2 (TAT 6-24 HRS) Nasopharyngeal Nasopharyngeal Swab     Status: None   Collection Time: 06/14/19  8:54 PM   Specimen: Nasopharyngeal Swab  Result Value Ref Range Status   SARS Coronavirus 2 NEGATIVE NEGATIVE Final    Comment: (NOTE) SARS-CoV-2 target  nucleic acids are NOT DETECTED. The SARS-CoV-2 RNA is generally detectable in upper and lower respiratory specimens during the acute phase of infection. Negative results do not preclude SARS-CoV-2 infection, do not rule out co-infections with other pathogens, and should not be used as the sole basis for treatment or other patient management decisions. Negative results must be combined with clinical observations, patient history, and epidemiological information. The expected result is Negative. Fact Sheet for Patients: HairSlick.no Fact Sheet for Healthcare Providers: quierodirigir.com This test is not yet approved or cleared by the [place] FDA and  has been authorized for detection and/or diagnosis of SARS-CoV-2 by FDA under an Emergency Use Authorization (EUA). This EUA will remain  in effect (meaning this test can be used) for the duration  of the COVID-19 declaration under Section 56 4(b)(1) of the Act, 21 U.S.C. section 360bbb-3(b)(1), unless the authorization is terminated or revoked sooner. Performed at [facility], [place] [address], [place], [place] [address]       Radiology Studies: CT Head Wo Contrast  Result Date: 06/14/2019 CLINICAL DATA:  Altered mental status EXAM: CT HEAD WITHOUT CONTRAST TECHNIQUE: Contiguous axial images were obtained from the base of the skull through the vertex without intravenous contrast. COMPARISON:  CT brain 08/27/2017 FINDINGS: Brain: No acute territorial infarction, hemorrhage or intracranial mass. The ventricles are nonenlarged. Vascular: No hyperdense vessels.  No unexpected calcification Skull: Normal. Negative for fracture or focal lesion. Sinuses/Orbits: No acute finding. Other: None IMPRESSION: Negative non contrasted CT appearance of the brain. Electronically Signed   By: Donavan Foil M.D.   On: 06/14/2019 20:01   DG Chest Port 1 View  Result Date: 06/14/2019 CLINICAL DATA:   Altered level of consciousness, vomiting, and diarrhea for 3 days, light smoker EXAM: PORTABLE CHEST 1 VIEW COMPARISON:  Portable exam 1834 hours compared to 06/09/2019 FINDINGS: Normal heart size, mediastinal contours, and pulmonary vascularity. Lungs clear. No pulmonary infiltrate, pleural effusion, or pneumothorax. Osseous structures unremarkable. IMPRESSION: No acute abnormalities. Electronically Signed   By: Lavonia Dana M.D.   On: 06/14/2019 19:04    Scheduled Meds: . folic acid  1 mg Oral Daily  . multivitamin with minerals  1 tablet Oral Daily  . thiamine  100 mg Oral Daily   Or  . thiamine  100 mg Intravenous Daily   Continuous Infusions:   LOS: 0 days   Time spent: 33 minutes   Darliss Cheney, MD [facility]  06/15/2019, 1:53 PM   To contact the attending provider between 7A-7P or the covering provider during after hours 7P-7A, please log into the web site [email or web address].

## 2019-06-15 NOTE — ED Notes (Signed)
Lunch Tray Ordered @ 1054. 

## 2019-06-15 NOTE — ED Notes (Signed)
Visually checked on Pt and she was sleeping , well, Vitals WDL

## 2019-06-16 ENCOUNTER — Encounter (HOSPITAL_COMMUNITY): Payer: Self-pay | Admitting: Surgery

## 2019-06-16 LAB — BASIC METABOLIC PANEL
Anion gap: 10 (ref 5–15)
BUN: <5 mg/dL — ABNORMAL LOW (ref 6–20)
CO2: 22 mmol/L (ref 22–32)
Calcium: 8.5 mg/dL — ABNORMAL LOW (ref 8.9–10.3)
Chloride: 103 mmol/L (ref 98–111)
Creatinine, Ser: 1 mg/dL (ref 0.44–1.00)
GFR calc Af Amer: >60 mL/min (ref >60)
GFR calc non Af Amer: >60 mL/min (ref >60)
Glucose, Bld: 91 mg/dL (ref 70–99)
Potassium: 3.8 mmol/L (ref 3.5–5.1)
Sodium: 135 mmol/L (ref 135–145)

## 2019-06-16 LAB — CBC
HCT: 35.2 % — ABNORMAL LOW (ref 36.0–46.0)
Hemoglobin: 11.6 g/dL — ABNORMAL LOW (ref 12.0–15.0)
MCH: 28.6 pg (ref 26.0–34.0)
MCHC: 33 g/dL (ref 30.0–36.0)
MCV: 86.7 fL (ref 80.0–100.0)
Platelets: 194 10*3/uL (ref 150–400)
RBC: 4.06 MIL/uL (ref 3.87–5.11)
RDW: 14.5 % (ref 11.5–15.5)
WBC: 5.5 10*3/uL (ref 4.0–10.5)
nRBC: 0 % (ref 0.0–0.2)

## 2019-06-16 LAB — URINE CULTURE

## 2019-06-16 MED ORDER — ADULT MULTIVITAMIN W/MINERALS CH: 1.0000 | ORAL_TABLET | Freq: Every day | ORAL | 0 refills | Status: DC

## 2019-06-16 MED ORDER — THIAMINE HCL 100 MG PO TABS: 100.0000 mg | ORAL_TABLET | Freq: Every day | ORAL | 0 refills | Status: DC

## 2019-06-16 MED ORDER — FOLIC ACID 1 MG PO TABS: 1.0000 mg | ORAL_TABLET | Freq: Every day | ORAL | 0 refills | Status: DC

## 2019-06-16 NOTE — Progress Notes (Signed)
Patient discharged to home with instructions and prescriptions. 

## 2019-06-16 NOTE — Discharge Summary (Signed)
Physician Discharge Summary  [name] JSE:[id number] DOB: April 28, 1965 DOA: 06/14/2019  PCP: Patient, No Pcp Per  Admit date: 06/14/2019 Discharge date: 06/16/2019  Time spent: 30 minutes  Recommendations for Outpatient Follow-up:  EtOH withdrawal/intoxication: -Last drink yesterday morning on 06/14/2019.  Has some withdrawal currently.  Slightly elevated CIWA score.  Received only few doses of Ativan in last 24 hours.  Agreeable to stay overnight to seek help to go through the system.  Continue CIWA protocol, multivitamin. -Follow-up with PCP EtOH abuse, help with placement into inpatient detox facility if this is what patient truly desires.  EtOH abuse  Lactic acidosis -Resolved resolved.  Acute kidney injury -Most likely secondary to dehydration caused by alcohol abuse:  Recent Labs  Lab 06/14/19 1512 06/15/19 0145 06/16/19 0207  CREATININE 2.01* 1.50* 1.00  -Resolved   Hypokalemia -Resolved  Depression, chronic:  -PCP to restart/hold home medication as they see fit.  Given patient's alcoholism may want to change meds.    Discharge Diagnoses:  Principal Problem:   Alcohol intoxication ([place]) Active Problems:   Major depression, recurrent, chronic (HCC)   Lactic acidosis   AKI (acute kidney injury) ([place])   Alcohol withdrawal ([place])   Discharge Condition: Stable  Diet recommendation: Regular  Filed Weights   06/14/19 1812 06/14/19 1813  Weight: 85.3 kg 85.3 kg    History of present illness:  54 y.o.femalewith PMH of MDD and alcohol abuse was brought to ER by family due to concern for alcohol intoxication. Found to have elevated lactate and AKI. Patient unable to provide almost any history. Reports feeling nauseous and that she had abdominal pain earlier but not currently. Unable to assess further.Per ED who spoke with patient's brother: "They have been intervening in the patient's drinking recently. Per family, patient is suspected of sneaking out today  and drinking. Patient admits to drinking a bottle of wine earlier today. She also reports several days of vomiting and agitation with decrease in drinking. No seizures or hallucinations per family." Per son [name], he reports that she has been an alcoholic for as long as he can remember. Denies hx of withdrawal seizures, DT's, hallucinations, SI/HI. She will drink anything but prefers wine.   In the ED: Tachycardic with normal BP's and afebrile. Maintaining sats. CMP with Cr 2.01, glucose 149, AST 83, ALT 64. Lactate 3.6.  Hospital Course:  Rehydrated, renal function back to normal.  Stable for discharge    Discharge Exam: Vitals:   06/15/19 1505 06/15/19 1622 06/15/19 2024 06/16/19 0521  BP: 114/68 134/79 (!) 145/85 136/87  Pulse:  88 86 77  Resp:  18 16 16   Temp:  98.4 F (36.9 C) 98.2 F (36.8 C) 98.1 F (36.7 C)  TempSrc:  Oral Oral Oral  SpO2:  100% 100% 98%  Weight:      Height:        General exam: Appears calm and comfortable  Respiratory system: Clear to auscultation. Respiratory effort normal. Cardiovascular system: S1 & S2 heard, RRR. No JVD, murmurs, rubs, gallops or clicks. No pedal edema.   Discharge Instructions   Allergies as of 06/16/2019   No Known Allergies     Medication List    STOP taking these medications   citalopram 20 MG tablet Commonly known as: CELEXA   hydrOXYzine 10 MG tablet Commonly known as: ATARAX/VISTARIL   traZODone 50 MG tablet Commonly known as: DESYREL     TAKE these medications   folic acid 1 MG tablet Commonly known as: [employer]  Take 1 tablet (1 mg total) by mouth daily.   multivitamin with minerals Tabs tablet Take 1 tablet by mouth daily.   thiamine 100 MG tablet Take 1 tablet (100 mg total) by mouth daily.      No Known Allergies    The results of significant diagnostics from this hospitalization (including imaging, microbiology, ancillary and laboratory) are listed below for reference.    Significant  Diagnostic Studies: CT Head Wo Contrast  Result Date: 06/14/2019 CLINICAL DATA:  Altered mental status EXAM: CT HEAD WITHOUT CONTRAST TECHNIQUE: Contiguous axial images were obtained from the base of the skull through the vertex without intravenous contrast. COMPARISON:  CT brain 08/27/2017 FINDINGS: Brain: No acute territorial infarction, hemorrhage or intracranial mass. The ventricles are nonenlarged. Vascular: No hyperdense vessels.  No unexpected calcification Skull: Normal. Negative for fracture or focal lesion. Sinuses/Orbits: No acute finding. Other: None IMPRESSION: Negative non contrasted CT appearance of the brain. Electronically Signed   By: Jasmine Pang M.D.   On: 06/14/2019 20:01   DG Chest Port 1 View  Result Date: 06/14/2019 CLINICAL DATA:  Altered level of consciousness, vomiting, and diarrhea for 3 days, light smoker EXAM: PORTABLE CHEST 1 VIEW COMPARISON:  Portable exam 1834 hours compared to 06/09/2019 FINDINGS: Normal heart size, mediastinal contours, and pulmonary vascularity. Lungs clear. No pulmonary infiltrate, pleural effusion, or pneumothorax. Osseous structures unremarkable. IMPRESSION: No acute abnormalities. Electronically Signed   By: Ulyses Southward M.D.   On: 06/14/2019 19:04    Microbiology: Recent Results (from the past 240 hour(s))  SARS CORONAVIRUS 2 (TAT 6-24 HRS) Nasopharyngeal Nasopharyngeal Swab     Status: None   Collection Time: 06/14/19  8:54 PM   Specimen: Nasopharyngeal Swab  Result Value Ref Range Status   SARS Coronavirus 2 NEGATIVE NEGATIVE Final    Comment: (NOTE) SARS-CoV-2 target nucleic acids are NOT DETECTED. The SARS-CoV-2 RNA is generally detectable in upper and lower respiratory specimens during the acute phase of infection. Negative results do not preclude SARS-CoV-2 infection, do not rule out co-infections with other pathogens, and should not be used as the sole basis for treatment or other patient management decisions. Negative results  must be combined with clinical observations, patient history, and epidemiological information. The expected result is Negative. Fact Sheet for Patients: HairSlick.no Fact Sheet for Healthcare Providers: quierodirigir.com This test is not yet approved or cleared by the [place] FDA and  has been authorized for detection and/or diagnosis of SARS-CoV-2 by FDA under an Emergency Use Authorization (EUA). This EUA will remain  in effect (meaning this test can be used) for the duration of the COVID-19 declaration under Section 56 4(b)(1) of the Act, 21 U.S.C. section 360bbb-3(b)(1), unless the authorization is terminated or revoked sooner. Performed at [facility], [address], [place], [place] [address]      Labs: Basic Metabolic Panel: Recent Labs  Lab 06/14/19 1512 06/15/19 0115 06/15/19 0145 06/16/19 0207  NA 139  --  140 135  K 4.2  --  3.2* 3.8  CL 100  --  106 103  CO2 21*  --  24 22  GLUCOSE 149*  --  130* 91  BUN 10  --  6 <5*  CREATININE 2.01*  --  1.50* 1.00  CALCIUM 8.6*  --  7.8* 8.5*  MG  --  2.0  --   --   PHOS  --  2.6  --   --    Liver Function Tests: Recent Labs  Lab 06/14/19 1512 06/15/19 0145  AST 83* 46*  ALT 64* 49*  ALKPHOS 59 48  BILITOT 1.4* 0.7  PROT 7.0 5.7*  ALBUMIN 3.8 3.1*   Recent Labs  Lab 06/14/19 1512  LIPASE 37   Recent Labs  Lab 06/14/19 1655  AMMONIA 15   CBC: Recent Labs  Lab 06/14/19 1512 06/15/19 0145 06/16/19 0207  WBC 8.8 7.9 5.5  HGB 13.5 10.9* 11.6*  HCT 40.2 33.3* 35.2*  MCV 85.5 88.8 86.7  PLT 286 207 194   Cardiac Enzymes: No results for input(s): CKTOTAL, CKMB, CKMBINDEX, TROPONINI in the last 168 hours. BNP: BNP (last 3 results) No results for input(s): BNP in the last 8760 hours.  ProBNP (last 3 results) No results for input(s): PROBNP in the last 8760 hours.  CBG: No results for input(s): GLUCAP in the last 168  hours.     Signed:  Carolyne Littles, MD [facility] [phone] pager

## 2019-09-16 ENCOUNTER — Encounter (HOSPITAL_COMMUNITY): Payer: Self-pay

## 2019-09-16 ENCOUNTER — Other Ambulatory Visit: Payer: Self-pay

## 2019-09-16 ENCOUNTER — Emergency Department (HOSPITAL_COMMUNITY)
Admission: EM | Admit: 2019-09-16 | Discharge: 2019-09-16 | Disposition: A | Discharge status: Home / Self Care | Payer: BC Managed Care – PPO | Attending: Emergency Medicine | Admitting: Emergency Medicine

## 2019-09-16 DIAGNOSIS — Z720 Tobacco use: Secondary | ICD-10-CM | POA: Diagnosis not present

## 2019-09-16 DIAGNOSIS — F1010 Alcohol abuse, uncomplicated: Secondary | ICD-10-CM | POA: Insufficient documentation

## 2019-09-16 DIAGNOSIS — Z79899 Other long term (current) drug therapy: Secondary | ICD-10-CM | POA: Diagnosis not present

## 2019-09-16 DIAGNOSIS — F411 Generalized anxiety disorder: Secondary | ICD-10-CM

## 2019-09-16 MED ORDER — CHLORDIAZEPOXIDE HCL 25 MG PO CAPS: ORAL_CAPSULE | ORAL | 0 refills | Status: DC

## 2019-09-16 MED ORDER — CHLORDIAZEPOXIDE HCL 25 MG PO CAPS
50.0000 mg | ORAL_CAPSULE | Freq: Once | ORAL | Status: AC
  Administered 2019-09-16: 50 mg via ORAL
  Filled 2019-09-16: qty 2

## 2019-09-16 NOTE — ED Triage Notes (Signed)
Onset 3 days started drinking 2-3 bottles wine a day.  States she doesn't drink all the time just that when she drinks she drinks a lot.   Pt states she is being aggressive to her family and they are mad at her.   Last alcohol was this morning. Pt states "I want to stop this".  Pt feeling anxious and nauseaous.

## 2019-09-16 NOTE — Discharge Instructions (Signed)
It was so wonderful meeting you today.  Provided prescription for Librium, this will help ease you off of your wine and help with your anxious symptoms in the short-term.  It is of the utmost importance that you establish with a therapist around here or [facility] to address your anxiety.  We additionally recommend that you follow-up with your primary care provider to discuss alternative anxiety options to maintain sobriety long-term.

## 2019-09-16 NOTE — ED Notes (Signed)
Pt walked out of emergency department

## 2019-09-16 NOTE — ED Provider Notes (Signed)
[facility] EMERGENCY DEPARTMENT Provider Note   CSN: [id number] Arrival date & time: 09/16/19  1526     History Chief Complaint  Patient presents with   Alcohol Problem    [name] is a 54 y.o. female with a past medical history significant for chronic intermittent alcohol use and generalized anxiety presenting with her husband for alcohol cessation resources in the community.  She reports a 3-year history of generalized anxiety and intermittent alcohol use.  Reports she will start feeling increasingly anxious and will go on 1-2-week alcohol binges.  She currently has been drinking since Monday, last drink this morning.  Drinks Chardonnay only, reports about 4 glasses daily and has had 2 today.  She has tried medications in the past (cannot member the name, Celexa in her chart) for her anxiety but did not feel like they worked, only took for a few days.  Worries about little things constantly.  Denies feeling down or depressed, thoughts of self-harm, suicidal or homicidal ideation.  Feels safe at home and has support of her husband and sister.  No associated nausea, abdominal pain, fever.  States she "has a fortunate life and wants to do better for her health for her family."  Works as a Teacher, adult education.    Past Medical History:  Diagnosis Date   Complication of anesthesia    " panic attacks after "   Depression    GERD (gastroesophageal reflux disease)     Patient Active Problem List   Diagnosis Date Noted   Alcohol withdrawal (HCC) 06/15/2019   Alcohol intoxication (HCC) 06/14/2019   Lactic acidosis 06/14/2019   AKI (acute kidney injury) (HCC) 06/14/2019   Major depression, recurrent, chronic (HCC) 08/29/2017    Past Surgical History:  Procedure Laterality Date   CESAREAN SECTION     CHOLECYSTECTOMY       OB History    Gravida  1   Para      Term      Preterm      AB      Living        SAB      TAB      Ectopic      Multiple      Live Births              History reviewed. No pertinent family history.  Social History   Tobacco Use   Smoking status: Light Tobacco Smoker    Packs/day: 0.25    Years: 10.00    Pack years: 2.50    Types: Cigarettes   Smokeless tobacco: Never Used  Substance Use Topics   Alcohol use: Yes    Comment: " I can go for days & not drink and then when I drink I may start drinnking every day "   Drug use: Never    Home Medications Prior to Admission medications   Medication Sig Start Date End Date Taking? Authorizing Provider  chlordiazePOXIDE (LIBRIUM) 25 MG capsule 50mg  PO TID x 1D, then 25-50mg  PO BID X 1D, then 25-50mg  PO QD X 1D 09/16/19   11/16/19, MD  folic acid (FOLVITE) 1 MG tablet Take 1 tablet (1 mg total) by mouth daily. 06/16/19   08/16/19, MD  Multiple Vitamin (MULTIVITAMIN WITH MINERALS) TABS tablet Take 1 tablet by mouth daily. 06/16/19   08/16/19, MD  thiamine 100 MG tablet Take 1 tablet (100 mg total) by mouth daily. 06/16/19   08/16/19  J, MD  citalopram (CELEXA) 20 MG tablet Take 1 tablet (20 mg total) by mouth daily. Patient not taking: Reported on 06/14/2019 09/01/17 06/14/19  Patrecia Pour, NP  traZODone (DESYREL) 50 MG tablet Take 1 tablet (50 mg total) by mouth at bedtime as needed for sleep. Patient not taking: Reported on 06/14/2019 08/31/17 06/14/19  Patrecia Pour, NP    Allergies    Patient has no known allergies.  Review of Systems   Review of Systems  Constitutional: Negative for fatigue and fever.  Gastrointestinal: Positive for nausea. Negative for abdominal distention, abdominal pain, constipation, diarrhea and vomiting.  Neurological: Negative for dizziness, tremors and light-headedness.  Psychiatric/Behavioral: Positive for sleep disturbance. Negative for confusion, hallucinations, self-injury and suicidal ideas. The patient is nervous/anxious.     Physical Exam Updated Vital Signs BP (!) 140/91 (BP Location:  Left Arm)    Pulse 97    Temp 99 F (37.2 C) (Oral)    Resp 18    SpO2 97%   Physical Exam Constitutional:      Appearance: Normal appearance.  HENT:     Head: Normocephalic and atraumatic.     Mouth/Throat:     Mouth: Mucous membranes are moist.  Eyes:     General: No scleral icterus.    Extraocular Movements: Extraocular movements intact.  Cardiovascular:     Rate and Rhythm: Normal rate and regular rhythm.     Pulses: Normal pulses.  Pulmonary:     Effort: Pulmonary effort is normal. No respiratory distress.  Abdominal:     General: Abdomen is flat. Bowel sounds are normal.     Palpations: Abdomen is soft.     Tenderness: There is no abdominal tenderness.  Skin:    General: Skin is warm and dry.  Neurological:     General: No focal deficit present.     Mental Status: She is alert.  Psychiatric:        Thought Content: Thought content normal.        Judgment: Judgment normal.     Comments: Appears anxious.     ED Results / Procedures / Treatments   Labs (all labs ordered are listed, but only abnormal results are displayed) Labs Reviewed - No data to display  EKG None  Radiology No results found.  Procedures Procedures (including critical care time)  Medications Ordered in ED Medications  chlordiazePOXIDE (LIBRIUM) capsule 50 mg (has no administration in time range)    ED Course  I have reviewed the triage vital signs and the nursing notes.  Pertinent labs & imaging results that were available during my care of the patient were reviewed by me and considered in my medical decision making (see chart for details).    MDM Rules/Calculators/A&P                      54 year old female with generalized anxiety disorder and alcohol use presenting for cessation and anxiety resources.  Husband at bedside and supportive. Rx'd Librium taper for alcohol withdrawal and current anxiety, given one dose prior to discharge.  Provided with substance cessation and therapy  counseling resources in the community.  Stressed importance of following up with primary care provider to discuss long-term anti-anxiety medications.   Final Clinical Impression(s) / ED Diagnoses Final diagnoses:  Alcohol abuse  Generalized anxiety disorder    Rx / DC Orders ED Discharge Orders         Ordered    chlordiazePOXIDE (LIBRIUM) 25  MG capsule     09/16/19 1757          Allayne Stack, DO   [place], [place], DO 09/16/19 1843    Blane Ohara, MD 09/17/19 [phone]

## 2020-07-28 IMAGING — CT CT HEAD W/O CM
4 series · 16 of 47 positions shown, 18 images · non-contrast
Comparison: CT brain 08/27/2017

CLINICAL DATA: Altered mental status

EXAM:
CT HEAD WITHOUT CONTRAST
TECHNIQUE: Contiguous axial images were obtained from the base of the skull
through the vertex without intravenous contrast.

[Series 3: head wo · axial · 0.37mm/px · z∈[+1191,+1325]mm · 7 of 37 slices shown, 9 images]
[im 5/37  brain]
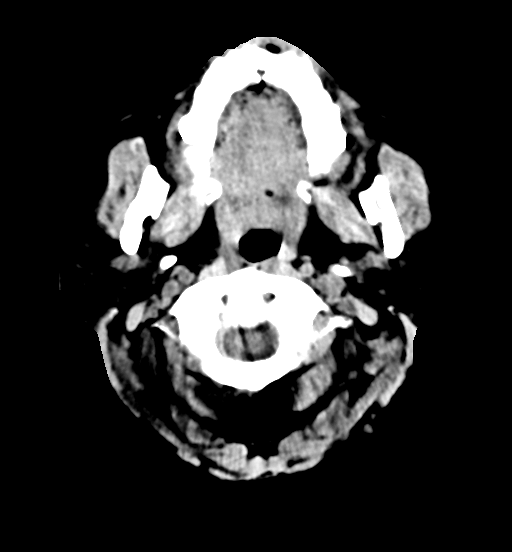
[im 5/37  bone]
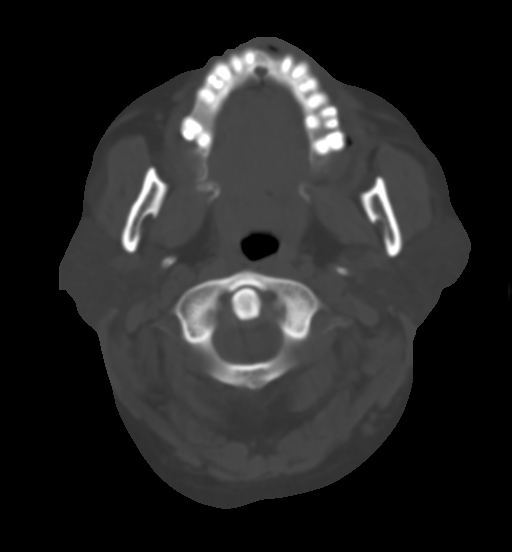
[im 10/37  brain]
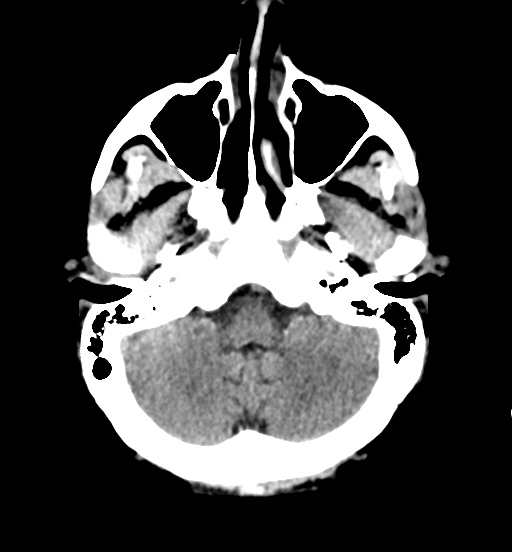
[im 14/37  brain]
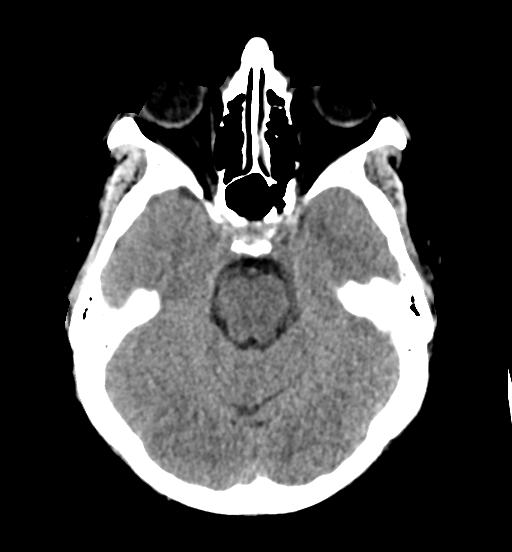
[im 19/37  brain]
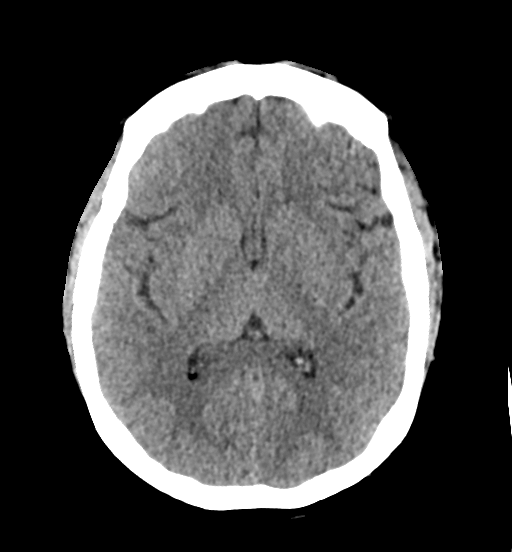
[im 23/37  brain]
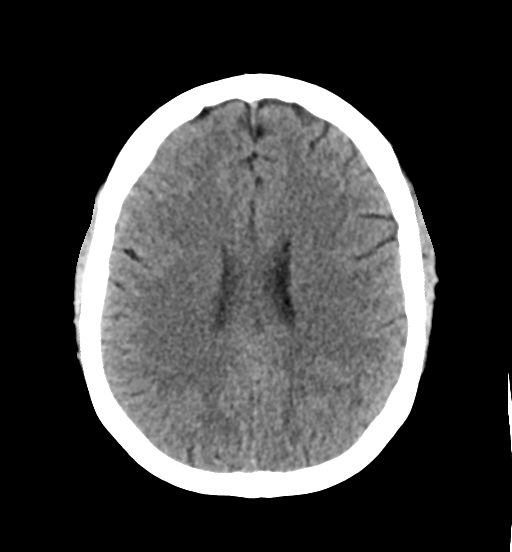
[im 23/37  bone]
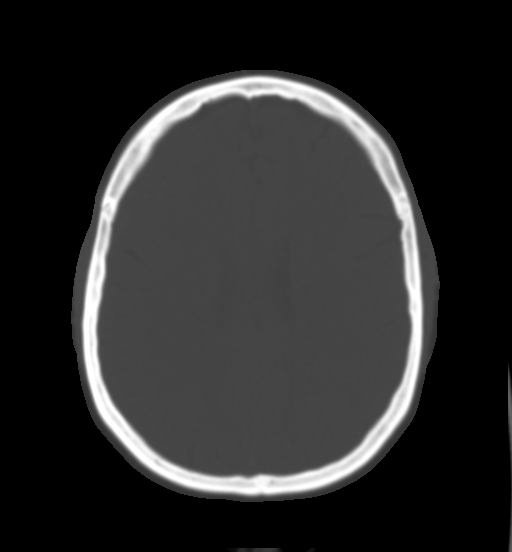
[im 28/37  brain]
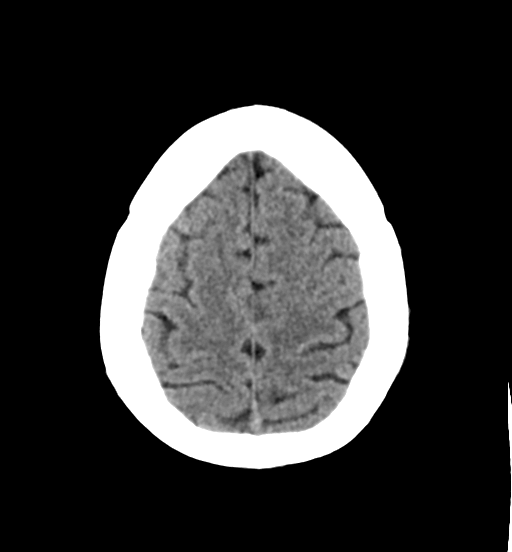
[im 32/37  brain]
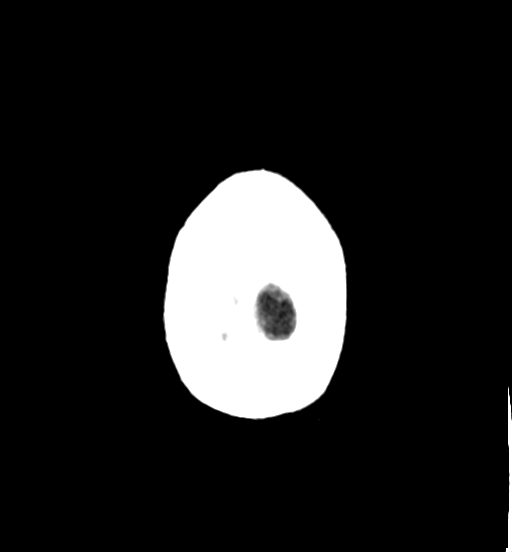

[Series 4: head bone · axial · 0.43mm/px · z∈[+1176,+1212]mm · 3 of 90 slices shown]
[im 9/90  bone]
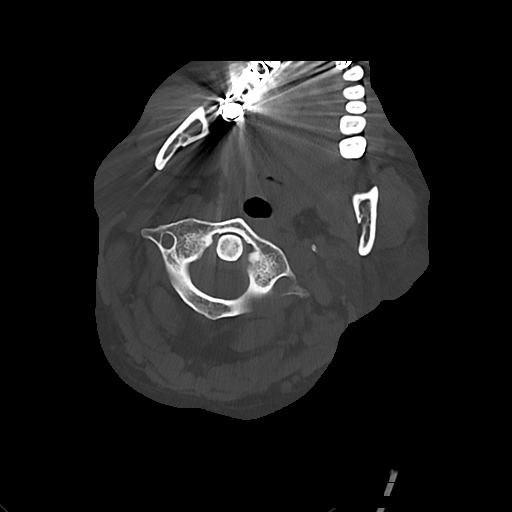
[im 18/90  bone]
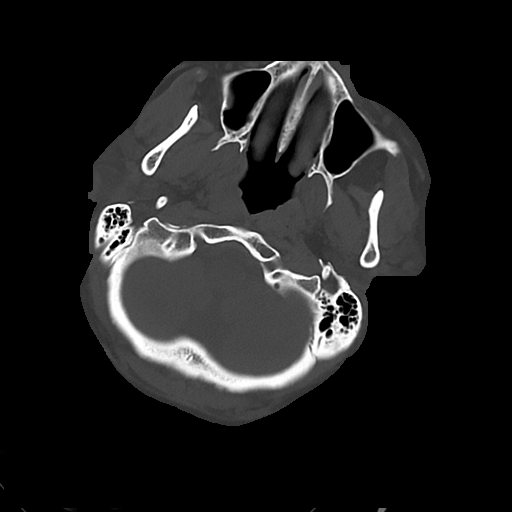
[im 27/90  bone]
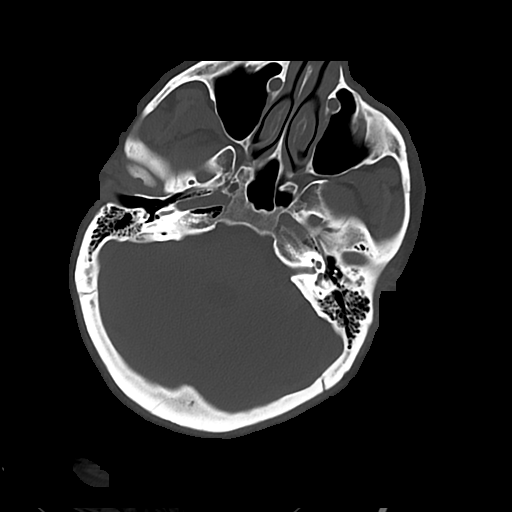

[Series 5: cor soft · coronal · 0.36mm/px · 3 of 73 slices shown]
[im 25/73  brain]
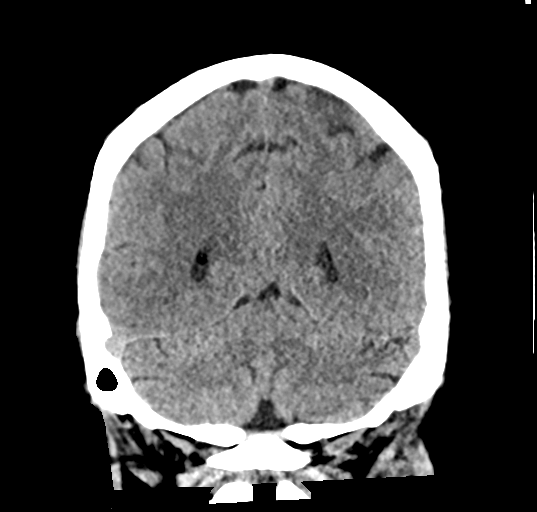
[im 33/73  brain]
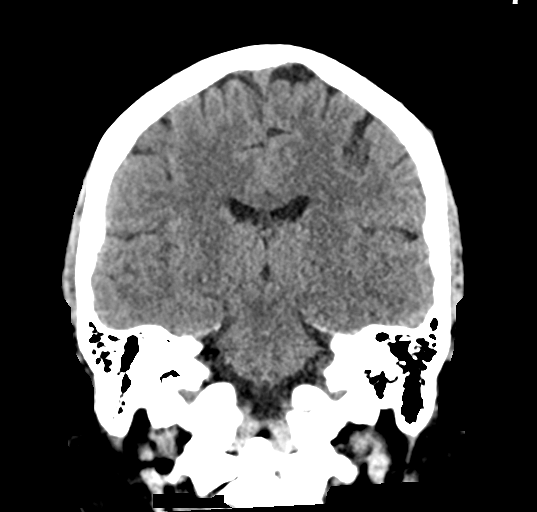
[im 41/73  brain]
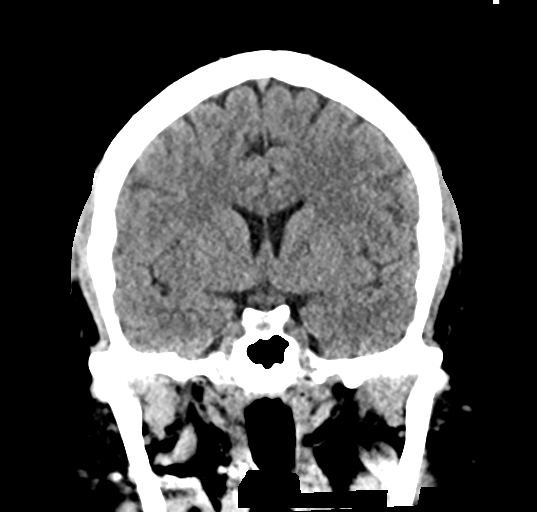

[Series 6: sag soft · sagittal · 0.35mm/px · 3 of 64 slices shown]
[im 22/64  brain]
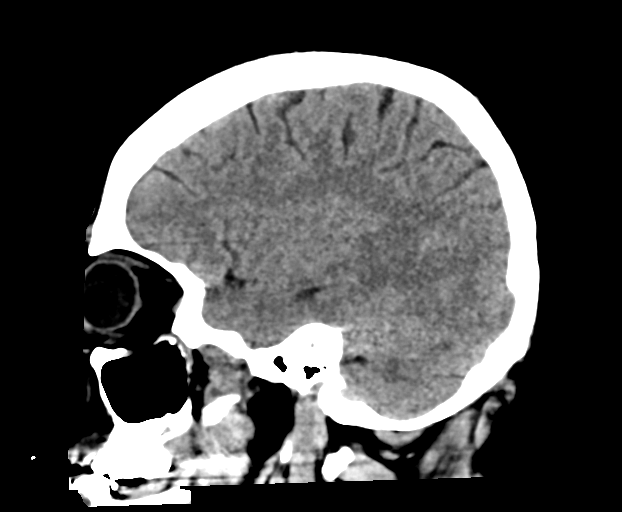
[im 32/64  brain]
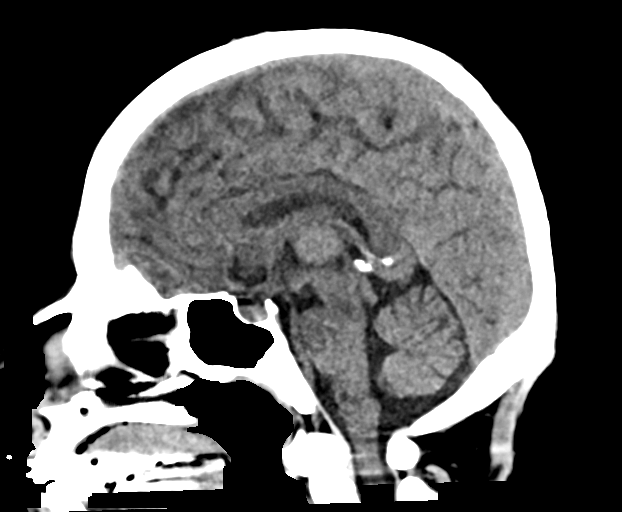
[im 43/64  brain]
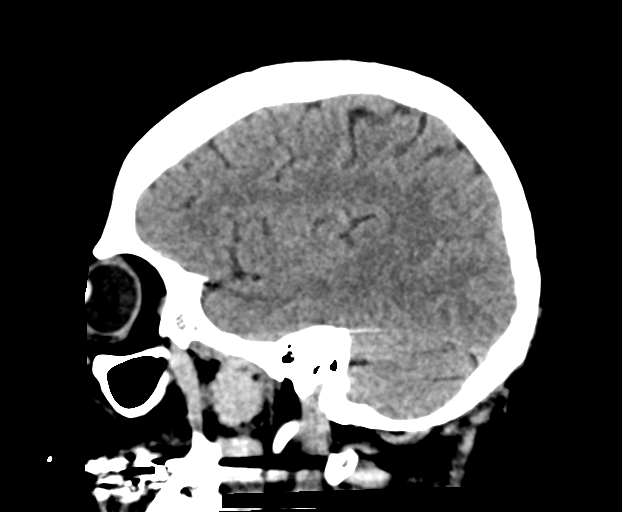

[16 of 47 positions shown; findings below may reference images not displayed]

FINDINGS: Brain: No acute territorial infarction, hemorrhage or intracranial
mass. The ventricles are nonenlarged.

Vascular: No hyperdense vessels.  No unexpected calcification

Skull: Normal. Negative for fracture or focal lesion.

Sinuses/Orbits: No acute finding.

Other: None
IMPRESSION: Negative non contrasted CT appearance of the brain.

## 2022-03-17 ENCOUNTER — Emergency Department (HOSPITAL_COMMUNITY): Payer: Self-pay

## 2022-03-17 ENCOUNTER — Other Ambulatory Visit: Payer: Self-pay

## 2022-03-17 ENCOUNTER — Emergency Department (HOSPITAL_COMMUNITY)
Admission: EM | Admit: 2022-03-17 | Discharge: 2022-03-17 | Disposition: A | Discharge status: Home / Self Care | Payer: Self-pay | Attending: Emergency Medicine | Admitting: Emergency Medicine

## 2022-03-17 DIAGNOSIS — R7401 Elevation of levels of liver transaminase levels: Secondary | ICD-10-CM | POA: Insufficient documentation

## 2022-03-17 DIAGNOSIS — K209 Esophagitis, unspecified without bleeding: Secondary | ICD-10-CM | POA: Insufficient documentation

## 2022-03-17 DIAGNOSIS — R7989 Other specified abnormal findings of blood chemistry: Secondary | ICD-10-CM | POA: Insufficient documentation

## 2022-03-17 DIAGNOSIS — R1013 Epigastric pain: Secondary | ICD-10-CM

## 2022-03-17 DIAGNOSIS — R780 Finding of alcohol in blood: Secondary | ICD-10-CM | POA: Insufficient documentation

## 2022-03-17 DIAGNOSIS — Z87891 Personal history of nicotine dependence: Secondary | ICD-10-CM | POA: Insufficient documentation

## 2022-03-17 LAB — COMPREHENSIVE METABOLIC PANEL
ALT: 91 U/L — ABNORMAL HIGH (ref 0–44)
AST: 114 U/L — ABNORMAL HIGH (ref 15–41)
Albumin: 3.5 g/dL (ref 3.5–5.0)
Alkaline Phosphatase: 81 U/L (ref 38–126)
Anion gap: 11 (ref 5–15)
BUN: 16 mg/dL (ref 6–20)
CO2: 23 mmol/L (ref 22–32)
Calcium: 8.3 mg/dL — ABNORMAL LOW (ref 8.9–10.3)
Chloride: 97 mmol/L — ABNORMAL LOW (ref 98–111)
Creatinine, Ser: 0.67 mg/dL (ref 0.44–1.00)
GFR, Estimated: >60 mL/min (ref >60)
Glucose, Bld: 147 mg/dL — ABNORMAL HIGH (ref 70–99)
Potassium: 3.5 mmol/L (ref 3.5–5.1)
Sodium: 131 mmol/L — ABNORMAL LOW (ref 135–145)
Total Bilirubin: 0.5 mg/dL (ref 0.3–1.2)
Total Protein: 7.2 g/dL (ref 6.5–8.1)

## 2022-03-17 LAB — CBC
HCT: 38.3 % (ref 36.0–46.0)
Hemoglobin: 13.3 g/dL (ref 12.0–15.0)
MCH: 27.8 pg (ref 26.0–34.0)
MCHC: 34.7 g/dL (ref 30.0–36.0)
MCV: 80 fL (ref 80.0–100.0)
Platelets: 186 10*3/uL (ref 150–400)
RBC: 4.79 MIL/uL (ref 3.87–5.11)
RDW: 13.8 % (ref 11.5–15.5)
WBC: 4.7 10*3/uL (ref 4.0–10.5)
nRBC: 0 % (ref 0.0–0.2)

## 2022-03-17 LAB — URINALYSIS, ROUTINE W REFLEX MICROSCOPIC
Bacteria, UA: NONE SEEN
Bilirubin Urine: NEGATIVE
Glucose, UA: NEGATIVE mg/dL
Ketones, ur: NEGATIVE mg/dL
Leukocytes,Ua: NEGATIVE
Nitrite: NEGATIVE
Protein, ur: NEGATIVE mg/dL
Specific Gravity, Urine: 1.012 (ref 1.005–1.030)
pH: 5 (ref 5.0–8.0)

## 2022-03-17 LAB — LIPASE, BLOOD: Lipase: 30 U/L (ref 11–51)

## 2022-03-17 LAB — PREGNANCY, URINE: Preg Test, Ur: NEGATIVE

## 2022-03-17 LAB — ETHANOL: Alcohol, Ethyl (B): 334 mg/dL (ref <10)

## 2022-03-17 LAB — POC URINE PREG, ED: Preg Test, Ur: NEGATIVE

## 2022-03-17 LAB — MAGNESIUM: Magnesium: 1.9 mg/dL (ref 1.7–2.4)

## 2022-03-17 MED ORDER — OMEPRAZOLE 20 MG PO CPDR: 20.0000 mg | DELAYED_RELEASE_CAPSULE | Freq: Every day | ORAL | 0 refills | Status: DC

## 2022-03-17 MED ORDER — HYDROXYZINE HCL 25 MG PO TABS: 25.0000 mg | ORAL_TABLET | Freq: Four times a day (QID) | ORAL | 0 refills | Status: DC

## 2022-03-17 MED ORDER — SODIUM CHLORIDE 0.9 % IV BOLUS
1000.0000 mL | Freq: Once | INTRAVENOUS | Status: AC
  Administered 2022-03-17: 1000 mL via INTRAVENOUS

## 2022-03-17 MED ORDER — PANTOPRAZOLE SODIUM 40 MG PO TBEC
40.0000 mg | DELAYED_RELEASE_TABLET | Freq: Every day | ORAL | Status: DC
  Administered 2022-03-17: 40 mg via ORAL
  Filled 2022-03-17: qty 1

## 2022-03-17 MED ORDER — ONDANSETRON 4 MG PO TBDP
4.0000 mg | ORAL_TABLET | Freq: Once | ORAL | Status: AC
  Administered 2022-03-17: 4 mg via ORAL
  Filled 2022-03-17: qty 1

## 2022-03-17 MED ORDER — HYDROXYZINE HCL 25 MG PO TABS
25.0000 mg | ORAL_TABLET | Freq: Once | ORAL | Status: AC
  Administered 2022-03-17: 25 mg via ORAL
  Filled 2022-03-17: qty 1

## 2022-03-17 NOTE — Discharge Instructions (Addendum)
Omeprazole as prescribed. Please follow up with GI, call to schedule this appointment.  Hydroxyzine as needed as prescribed for anxiety.  Follow up with outpatient resources for alcohol use.

## 2022-03-17 NOTE — ED Notes (Signed)
Triage nurse, Maggie, RN) notified pt would like to have something for nausea.

## 2022-03-17 NOTE — ED Provider Notes (Signed)
[facility], [place] EMERGENCY DEPARTMENT Provider Note   CSN: [id number] Arrival date & time: 03/17/22  2595     History  Chief Complaint  Patient presents with   Abdominal Pain   Nausea   Emesis    [name] is a 56 y.o. female.  56 year old female with complaint of abdominal pain (epigastric) with nausea, vomiting, diarrhea. Onset 3-4 days ago, has been drinking heavily to treat the pain, feels like she has been in withdrawal x 3 days. Drinking a 12 pack of beer daily, last drank this morning.  Quit smoking a few months ago.  Denies fevers, chills.  Reports catching a virus 5 days ago, unknown what virus, went to the Dr but can't be more specific about this.        Home Medications Prior to Admission medications   Medication Sig Start Date End Date Taking? Authorizing Provider  hydrOXYzine (ATARAX) 25 MG tablet Take 1 tablet (25 mg total) by mouth every 6 (six) hours. 03/17/22  Yes Jeannie Fend, PA-C  omeprazole (PRILOSEC) 20 MG capsule Take 1 capsule (20 mg total) by mouth daily. 03/17/22  Yes Jeannie Fend, PA-C  chlordiazePOXIDE (LIBRIUM) 25 MG capsule 50mg  PO TID x 1D, then 25-50mg  PO BID X 1D, then 25-50mg  PO QD X 1D 09/16/19   Blane Ohara, MD  folic acid (FOLVITE) 1 MG tablet Take 1 tablet (1 mg total) by mouth daily. 06/16/19   Drema Dallas, MD  Multiple Vitamin (MULTIVITAMIN WITH MINERALS) TABS tablet Take 1 tablet by mouth daily. 06/16/19   Drema Dallas, MD  thiamine 100 MG tablet Take 1 tablet (100 mg total) by mouth daily. 06/16/19   Drema Dallas, MD  citalopram (CELEXA) 20 MG tablet Take 1 tablet (20 mg total) by mouth daily. Patient not taking: Reported on 06/14/2019 09/01/17 06/14/19  Charm Rings, NP  traZODone (DESYREL) 50 MG tablet Take 1 tablet (50 mg total) by mouth at bedtime as needed for sleep. Patient not taking: Reported on 06/14/2019 08/31/17 06/14/19  Charm Rings, NP      Allergies    Patient has no known allergies.     Review of Systems   Review of Systems Negative except as per HPI Physical Exam Updated Vital Signs BP 125/72 (BP Location: Right Arm)   Pulse (!) 106   Temp 98.6 F (37 C) (Oral)   Resp 18   Ht 5\' 4"  (1.626 m)   Wt 90.7 kg   SpO2 96%   BMI 34.33 kg/m  Physical Exam Vitals and nursing note reviewed.  Constitutional:      General: She is not in acute distress.    Appearance: She is well-developed. She is not diaphoretic.  HENT:     Head: Normocephalic and atraumatic.  Cardiovascular:     Rate and Rhythm: Normal rate and regular rhythm.     Heart sounds: Normal heart sounds.  Pulmonary:     Effort: Pulmonary effort is normal.     Breath sounds: Normal breath sounds.  Abdominal:     Palpations: Abdomen is soft.     Tenderness: There is generalized abdominal tenderness.  Skin:    General: Skin is warm and dry.     Findings: No erythema or rash.  Neurological:     Mental Status: She is alert and oriented to person, place, and time.  Psychiatric:        Behavior: Behavior normal.     ED Results /  Procedures / Treatments   Labs (all labs ordered are listed, but only abnormal results are displayed) Labs Reviewed  COMPREHENSIVE METABOLIC PANEL - Abnormal; Notable for the following components:      Result Value   Sodium 131 (*)    Chloride 97 (*)    Glucose, Bld 147 (*)    Calcium 8.3 (*)    AST 114 (*)    ALT 91 (*)    All other components within normal limits  URINALYSIS, ROUTINE W REFLEX MICROSCOPIC - Abnormal; Notable for the following components:   Hgb urine dipstick SMALL (*)    All other components within normal limits  ETHANOL - Abnormal; Notable for the following components:   Alcohol, Ethyl (B) 334 (*)    All other components within normal limits  LIPASE, BLOOD  CBC  MAGNESIUM  PREGNANCY, URINE  POC URINE PREG, ED  I-STAT BETA HCG BLOOD, ED ([facility], [facility], [facility] ONLY)    EKG None  Radiology CT ABDOMEN PELVIS WO CONTRAST  Result Date:  03/17/2022 CLINICAL DATA:  Acute abdominal pain. Nausea and vomiting. EXAM: CT ABDOMEN AND PELVIS WITHOUT CONTRAST TECHNIQUE: Multidetector CT imaging of the abdomen and pelvis was performed following the standard protocol without IV contrast. RADIATION DOSE REDUCTION: This exam was performed according to the departmental dose-optimization program which includes automated exposure control, adjustment of the mA and/or kV according to patient size and/or use of iterative reconstruction technique. COMPARISON:  Contrast-enhanced CT 04/16/2020 FINDINGS: Lower chest: Minor subsegmental atelectasis in the right lower lobe. No pleural fluid. Small hiatal hernia with distal esophageal wall thickening. Hepatobiliary: The liver is enlarged spanning 22.7 cm cranial caudal. Diffuse hepatic steatosis. There is no evidence of focal liver abnormality on this unenhanced exam. Clips in the gallbladder fossa postcholecystectomy. No biliary dilatation. Pancreas: No ductal dilatation or inflammation. Spleen: Normal in size without focal abnormality. Adrenals/Urinary Tract: Normal adrenal glands. No hydronephrosis or renal calculi. No focal renal abnormalities on this unenhanced exam. Moderate bladder distention without wall thickening. Stomach/Bowel: Small hiatal hernia with wall thickening of the distal esophagus. There may be wall thickening extending to the gastroesophageal junction, series 3, image 17, not well assessed on the unenhanced exam. No small bowel obstruction or inflammation. Normal appendix. Majority of the colon is decompressed. There is no colonic inflammation. Vascular/Lymphatic: Mild aortic atherosclerosis. No aortic aneurysm. No bulky abdominopelvic adenopathy. Reproductive: IUD in the uterus. No adnexal mass. Other: No ascites. No free air or focal fluid collection. Chronic scarring in the included LL subcutaneous tissues. Musculoskeletal: There are no acute or suspicious osseous abnormalities. IMPRESSION: 1. Small  hiatal hernia with distal esophageal wall thickening, suggesting reflux or esophagitis. There may be wall thickening extending to the gastroesophageal junction, not well assessed on the unenhanced exam. Consider further evaluation with endoscopy. 2. Hepatomegaly and hepatic steatosis. Aortic Atherosclerosis (ICD10-I70.0). Electronically Signed   By: Narda Rutherford M.D.   On: 03/17/2022 17:04    Procedures Procedures    Medications Ordered in ED Medications  pantoprazole (PROTONIX) EC tablet 40 mg (40 mg Oral Given 03/17/22 1751)  ondansetron (ZOFRAN-ODT) disintegrating tablet 4 mg (4 mg Oral Given 03/17/22 1123)  sodium chloride 0.9 % bolus 1,000 mL (0 mLs Intravenous Stopped 03/17/22 1706)  hydrOXYzine (ATARAX) tablet 25 mg (25 mg Oral Given 03/17/22 1751)    ED Course/ Medical Decision Making/ A&P  Medical Decision Making Amount and/or Complexity of Data Reviewed Labs: ordered. Radiology: ordered.  Risk Prescription drug management.   This patient presents to the ED for concern of epigastric abdominal pain, n/v/d, this involves an extensive number of treatment options, and is a complaint that carries with it a high risk of complications and morbidity.  The differential diagnosis includes but not limited to gastroenteritis, colitis, SBO, ascites, intoxication, substance abuse, withdrawal    Co morbidities that complicate the patient evaluation  Alcohol abuse, depression, GERD Prior cholecystectomy, c-section    Additional history obtained:  External records from outside source obtained and reviewed including prior labs and imaging on file   Lab Tests:  I Ordered, and personally interpreted labs.  The pertinent results include: CBC unremarkable.  Lipase normal.  Urinalysis with small amount of hemoglobin, 0-5 red cells.  Pregnancy test negative.  Alcohol is significantly elevated at 334.  Magnesium normal at 1.9.  CMP with mildly elevated AST and ALT  at 114 and 91.  Alk phos and bili normal.   Imaging Studies ordered:  I ordered imaging studies including CT abdomen pelvis I independently visualized and interpreted imaging which showed hepatomegaly, hepatic steatosis, gastritis-esophagitis I agree with the radiologist interpretation   Problem List / ED Course / Critical interventions / Medication management  56 year old female presents with complaint of epigastric abdominal pain and alcohol use as above.  On exam profound generalized abdominal discomfort, no guarding or rebound.  Patient was provided with Zofran in triage, no longer vomiting.  Vitals reviewed, last drink today, do not believe to be in withdrawal at this time.  Patient is to be discharged to the care of her sister who was on her way to pick her up.  She is tolerating p.o.'s.  Request something for anxiety is provided with hydroxyzine.  Provided with Protonix for her esophagitis, will send prescription for omeprazole to her pharmacy.  Provided with outpatient resources and advised to call GI for follow-up. I ordered medication including hydroxyzine, Protonix for anxiety, esophagitis Reevaluation of the patient after these medicines showed that the patient stayed the same.  Provided with Zofran, vomiting controlled. I have reviewed the patients home medicines and have made adjustments as needed   Social Determinants of Health:  History of depression, no PCP on file, provided with outpatient resources   Test / Admission - Considered:  ER workup complete, does not require admission, recommend outpatient follow-up, provided with resources.         Final Clinical Impression(s) / ED Diagnoses Final diagnoses:  Epigastric pain  Esophagitis  Elevated LFTs    Rx / DC Orders ED Discharge Orders          Ordered    omeprazole (PRILOSEC) 20 MG capsule  Daily        03/17/22 1722    hydrOXYzine (ATARAX) 25 MG tablet  Every 6 hours        03/17/22 1722               Jeannie Fend, PA-C 03/17/22 1757    Wynetta Fines, MD 03/18/22 [phone]

## 2022-03-17 NOTE — ED Provider Triage Note (Signed)
Emergency Medicine Provider Triage Evaluation Note  Annahi Short , a 56 y.o. female  was evaluated in triage.  Pt complains of abdominal pain, nausea, and vomiting for 4 days.  Abdominal pain is at the upper abdomen.  She claims she has been drinking a lot but is unable to tell me how much this is.   Review of Systems  Positive:  Negative:   Physical Exam  BP 108/61 (BP Location: Right Arm)   Pulse 92   Temp 98.6 F (37 C)   Resp 15   Ht 5\' 4"  (1.626 m)   Wt 90.7 kg   SpO2 95%   BMI 34.33 kg/m  Gen:   Awake, no distress   Resp:  Normal effort  MSK:   Moves extremities without difficulty  Other:  Upper abdominal tenderness, nonperitonitic  Medical Decision Making  Medically screening exam initiated at 9:59 AM.  Appropriate orders placed.  Mykell Genao was informed that the remainder of the evaluation will be completed by another provider, this initial triage assessment does not replace that evaluation, and the importance of remaining in the ED until their evaluation is complete.     [name], PA-C 03/17/22 1000

## 2022-03-17 NOTE — ED Triage Notes (Signed)
Pt. Stated, [name] had stomach pain with N/V for 5 days.

## 2023-01-18 ENCOUNTER — Other Ambulatory Visit: Payer: Self-pay

## 2023-01-18 ENCOUNTER — Encounter (HOSPITAL_COMMUNITY): Payer: Self-pay

## 2023-01-18 ENCOUNTER — Emergency Department (HOSPITAL_COMMUNITY)
Admission: EM | Admit: 2023-01-18 | Discharge: 2023-01-18 | Disposition: A | Discharge status: Home / Self Care | Payer: BC Managed Care – PPO | Attending: Emergency Medicine | Admitting: Emergency Medicine

## 2023-01-18 DIAGNOSIS — R110 Nausea: Secondary | ICD-10-CM | POA: Insufficient documentation

## 2023-01-18 DIAGNOSIS — R531 Weakness: Secondary | ICD-10-CM | POA: Diagnosis not present

## 2023-01-18 DIAGNOSIS — F1010 Alcohol abuse, uncomplicated: Secondary | ICD-10-CM | POA: Diagnosis present

## 2023-01-18 DIAGNOSIS — R1084 Generalized abdominal pain: Secondary | ICD-10-CM | POA: Insufficient documentation

## 2023-01-18 DIAGNOSIS — Y908 Blood alcohol level of 240 mg/100 ml or more: Secondary | ICD-10-CM | POA: Diagnosis not present

## 2023-01-18 LAB — CBC
HCT: 38.4 % (ref 36.0–46.0)
Hemoglobin: 12.1 g/dL (ref 12.0–15.0)
MCH: 24.5 pg — ABNORMAL LOW (ref 26.0–34.0)
MCHC: 31.5 g/dL (ref 30.0–36.0)
MCV: 77.7 fL — ABNORMAL LOW (ref 80.0–100.0)
Platelets: 228 10*3/uL (ref 150–400)
RBC: 4.94 MIL/uL (ref 3.87–5.11)
RDW: 15.8 % — ABNORMAL HIGH (ref 11.5–15.5)
WBC: 5.3 10*3/uL (ref 4.0–10.5)
nRBC: 0 % (ref 0.0–0.2)

## 2023-01-18 LAB — RAPID URINE DRUG SCREEN, HOSP PERFORMED
Amphetamines: NOT DETECTED
Barbiturates: NOT DETECTED
Benzodiazepines: NOT DETECTED
Cocaine: NOT DETECTED
Opiates: NOT DETECTED
Tetrahydrocannabinol: NOT DETECTED

## 2023-01-18 LAB — COMPREHENSIVE METABOLIC PANEL
ALT: 47 U/L — ABNORMAL HIGH (ref 0–44)
AST: 69 U/L — ABNORMAL HIGH (ref 15–41)
Albumin: 3.6 g/dL (ref 3.5–5.0)
Alkaline Phosphatase: 82 U/L (ref 38–126)
Anion gap: 13 (ref 5–15)
BUN: 11 mg/dL (ref 6–20)
CO2: 21 mmol/L — ABNORMAL LOW (ref 22–32)
Calcium: 8 mg/dL — ABNORMAL LOW (ref 8.9–10.3)
Chloride: 101 mmol/L (ref 98–111)
Creatinine, Ser: 0.48 mg/dL (ref 0.44–1.00)
GFR, Estimated: >60 mL/min (ref >60)
Glucose, Bld: 105 mg/dL — ABNORMAL HIGH (ref 70–99)
Potassium: 3.8 mmol/L (ref 3.5–5.1)
Sodium: 135 mmol/L (ref 135–145)
Total Bilirubin: 0.4 mg/dL (ref 0.3–1.2)
Total Protein: 7.5 g/dL (ref 6.5–8.1)

## 2023-01-18 LAB — LIPASE, BLOOD: Lipase: 59 U/L — ABNORMAL HIGH (ref 11–51)

## 2023-01-18 LAB — ETHANOL: Alcohol, Ethyl (B): 363 mg/dL (ref <10)

## 2023-01-18 MED ORDER — CHLORDIAZEPOXIDE HCL 25 MG PO CAPS: ORAL_CAPSULE | ORAL | 0 refills | Status: AC

## 2023-01-18 MED ORDER — LORAZEPAM 1 MG PO TABS
2.0000 mg | ORAL_TABLET | Freq: Once | ORAL | Status: AC
  Administered 2023-01-18: 2 mg via ORAL
  Filled 2023-01-18: qty 2

## 2023-01-18 MED ORDER — ALUM & MAG HYDROXIDE-SIMETH 200-200-20 MG/5ML PO SUSP: 30.0000 mL | Freq: Once | ORAL | Status: DC

## 2023-01-18 MED ORDER — ONDANSETRON HCL 4 MG/2ML IJ SOLN
4.0000 mg | Freq: Once | INTRAMUSCULAR | Status: AC
  Administered 2023-01-18: 4 mg via INTRAVENOUS
  Filled 2023-01-18: qty 2

## 2023-01-18 MED ORDER — SODIUM CHLORIDE 0.9 % IV BOLUS
1000.0000 mL | Freq: Once | INTRAVENOUS | Status: AC
  Administered 2023-01-18: 1000 mL via INTRAVENOUS

## 2023-01-18 NOTE — ED Provider Notes (Signed)
[place] EMERGENCY DEPARTMENT AT [facility] Provider Note   CSN: [id number] Arrival date & time: 01/18/23  1109     History  Chief Complaint  Patient presents with   detox from ETOH    [name] is a 57 y.o. female.  57 year old female with prior medical history as detailed below presents for evaluation.  She reports longstanding history of intermittent heavy alcohol abuse.  She reports a recent binge of alcohol consumption over the last 3 days.  She reports that "I do not feel good today".  She is clinically intoxicated.  She complains of nausea, she complains of diffuse abdominal discomfort.  She complains of weakness.  Patient denies fever.  She denies chest pain or shortness of breath.  Her last reported alcohol intake was at approximately 930 this morning.  She denies suicidal or homicidal ideation.  The history is provided by the patient and medical records.       Home Medications Prior to Admission medications   Medication Sig Start Date End Date Taking? Authorizing Provider  chlordiazePOXIDE (LIBRIUM) 25 MG capsule 50mg  PO TID x 1D, then 25-50mg  PO BID X 1D, then 25-50mg  PO QD X 1D 09/16/19   Blane Ohara, MD  folic acid (FOLVITE) 1 MG tablet Take 1 tablet (1 mg total) by mouth daily. 06/16/19   Drema Dallas, MD  hydrOXYzine (ATARAX) 25 MG tablet Take 1 tablet (25 mg total) by mouth every 6 (six) hours. 03/17/22   Jeannie Fend, PA-C  Multiple Vitamin (MULTIVITAMIN WITH MINERALS) TABS tablet Take 1 tablet by mouth daily. 06/16/19   Drema Dallas, MD  omeprazole (PRILOSEC) 20 MG capsule Take 1 capsule (20 mg total) by mouth daily. 03/17/22   Jeannie Fend, PA-C  thiamine 100 MG tablet Take 1 tablet (100 mg total) by mouth daily. 06/16/19   Drema Dallas, MD  citalopram (CELEXA) 20 MG tablet Take 1 tablet (20 mg total) by mouth daily. Patient not taking: Reported on 06/14/2019 09/01/17 06/14/19  Charm Rings, NP  traZODone (DESYREL) 50 MG tablet Take  1 tablet (50 mg total) by mouth at bedtime as needed for sleep. Patient not taking: Reported on 06/14/2019 08/31/17 06/14/19  Charm Rings, NP      Allergies    Patient has no known allergies.    Review of Systems   Review of Systems  All other systems reviewed and are negative.   Physical Exam Updated Vital Signs BP 130/80   Pulse 92   Temp 98.4 F (36.9 C) (Oral)   Resp 18   Ht 5\' 4"  (1.626 m)   Wt 90.7 kg   SpO2 95%   BMI 34.32 kg/m  Physical Exam Vitals and nursing note reviewed.  Constitutional:      General: She is not in acute distress.    Appearance: Normal appearance. She is well-developed.  HENT:     Head: Normocephalic and atraumatic.  Eyes:     Conjunctiva/sclera: Conjunctivae normal.     Pupils: Pupils are equal, round, and reactive to light.  Cardiovascular:     Rate and Rhythm: Normal rate and regular rhythm.     Heart sounds: Normal heart sounds.  Pulmonary:     Effort: Pulmonary effort is normal. No respiratory distress.     Breath sounds: Normal breath sounds.  Abdominal:     General: There is no distension.     Palpations: Abdomen is soft.     Tenderness: There is no  abdominal tenderness.  Musculoskeletal:        General: No deformity. Normal range of motion.     Cervical back: Normal range of motion and neck supple.  Skin:    General: Skin is warm and dry.  Neurological:     General: No focal deficit present.     Mental Status: She is alert and oriented to person, place, and time.     ED Results / Procedures / Treatments   Labs (all labs ordered are listed, but only abnormal results are displayed) Labs Reviewed  COMPREHENSIVE METABOLIC PANEL - Abnormal; Notable for the following components:      Result Value   CO2 21 (*)    Glucose, Bld 105 (*)    Calcium 8.0 (*)    AST 69 (*)    ALT 47 (*)    All other components within normal limits  ETHANOL - Abnormal; Notable for the following components:   Alcohol, Ethyl (B) 363 (*)    All  other components within normal limits  CBC - Abnormal; Notable for the following components:   MCV 77.7 (*)    MCH 24.5 (*)    RDW 15.8 (*)    All other components within normal limits  RAPID URINE DRUG SCREEN, HOSP PERFORMED  LIPASE, BLOOD    EKG None  Radiology No results found.  Procedures Procedures    Medications Ordered in ED Medications  sodium chloride 0.9 % bolus 1,000 mL (has no administration in time range)  ondansetron (ZOFRAN) injection 4 mg (has no administration in time range)  alum & mag hydroxide-simeth (MAALOX/MYLANTA) 200-200-20 MG/5ML suspension 30 mL (has no administration in time range)  LORazepam (ATIVAN) tablet 2 mg (2 mg Oral Given 01/18/23 1217)    ED Course/ Medical Decision Making/ A&P                                 Medical Decision Making Amount and/or Complexity of Data Reviewed Labs: ordered.  Risk OTC drugs. Prescription drug management.    Medical Screen Complete  This patient presented to the ED with complaint of alcohol abuse.  This complaint involves an extensive number of treatment options. The initial differential diagnosis includes, but is not limited to, alcohol intoxication, metabolic abnormality, alcohol withdrawal, etc.  This presentation is: Acute, Chronic, Self-Limited, Previously Undiagnosed, Uncertain Prognosis, Complicated, Systemic Symptoms, and Threat to Life/Bodily Function  Patient with longstanding history of excessive alcohol use and abuse.  Patient is presenting with complaint of malaise and fatigue related to recent alcohol binge.  Patient is clinically intoxicated with laboratory results reflecting this.  Patient feels much improved after IV fluids.  She is comfortable with plan for discharge home.  She is requesting Librium to help early withdrawal symptoms.  Importance of close follow-up is stressed.  Strict return precautions given and understood.  Additional history obtained: External records  from outside sources obtained and reviewed including prior ED visits and prior Inpatient records.    Lab Tests:  I ordered and personally interpreted labs.  The pertinent results include: CBC, CMP, EtOH, lipase, urine tox screen  Medicines ordered:  I ordered medication including IV fluids, Zofran, Ativan for dehydration Reevaluation of the patient after these medicines showed that the patient: improved  Problem List / ED Course:  Alcohol intoxication   Reevaluation:  After the interventions noted above, I reevaluated the patient and found that they have: improved  Disposition:  After consideration of the diagnostic results and the patients response to treatment, I feel that the patent would benefit from close outpatient follow-up.          Final Clinical Impression(s) / ED Diagnoses Final diagnoses:  Alcohol abuse    Rx / DC Orders ED Discharge Orders          Ordered    chlordiazePOXIDE (LIBRIUM) 25 MG capsule        01/18/23 1916              Wynetta Fines, MD 01/18/23 2222

## 2023-01-18 NOTE — Discharge Instructions (Addendum)
Return for any problem.  Drink alcohol in moderation.  Do NOT take librium and consume alcohol at the same time.

## 2023-01-18 NOTE — ED Provider Triage Note (Signed)
Emergency Medicine Provider Triage Evaluation Note  Jennine Peddy , a 57 y.o. female  was evaluated in triage.  Pt complains of alcohol intoxication. Patient reports that she drank "a lot" this morning. She does not remember what she drank, just that she got it from the gas station. Patient would like to get help.  Review of Systems  Positive: Acute alcohol intoxication Negative: Visual or auditory hallucinations, DTs  Physical Exam  BP 129/89   Pulse 92   Temp 98.6 F (37 C) (Oral)   Resp 16   Ht 5\' 4"  (1.626 m)   Wt 90.7 kg   SpO2 95%   BMI 34.32 kg/m  Gen:   Awake, no distress, intoxicated Resp:  Normal effort  MSK:   Moves extremities without difficulty  Other:    Medical Decision Making  Medically screening exam initiated at 12:07 PM.  Appropriate orders placed.  Anacristina Steffek was informed that the remainder of the evaluation will be completed by another provider, this initial triage assessment does not replace that evaluation, and the importance of remaining in the ED until their evaluation is complete.   Maxwell Marion, PA-C 01/18/23 1209

## 2023-01-18 NOTE — ED Triage Notes (Signed)
Pt wants to get help detox from ETOH. Pt's last ETOH intake was at 0940 today

## 2023-01-18 NOTE — ED Notes (Signed)
Spoke with Caryn Bee from main lab at this time, will add on lipase to existing labs.
